# Patient Record
Sex: Female | Born: 1950 | Race: White | Hispanic: No | Marital: Married | Smoking: Never smoker
Health system: Southern US, Community
[De-identification: ages and names within clinical notes are randomized; demographics above are authoritative.]

## PROBLEM LIST (undated history)

## (undated) DIAGNOSIS — M1990 Unspecified osteoarthritis, unspecified site: Secondary | ICD-10-CM

## (undated) DIAGNOSIS — I10 Essential (primary) hypertension: Secondary | ICD-10-CM

## (undated) DIAGNOSIS — R011 Cardiac murmur, unspecified: Secondary | ICD-10-CM

## (undated) DIAGNOSIS — E785 Hyperlipidemia, unspecified: Secondary | ICD-10-CM

## (undated) DIAGNOSIS — F329 Major depressive disorder, single episode, unspecified: Secondary | ICD-10-CM

## (undated) DIAGNOSIS — R51 Headache: Secondary | ICD-10-CM

## (undated) DIAGNOSIS — F32A Depression, unspecified: Secondary | ICD-10-CM

## (undated) DIAGNOSIS — H409 Unspecified glaucoma: Secondary | ICD-10-CM

## (undated) DIAGNOSIS — R519 Headache, unspecified: Secondary | ICD-10-CM

## (undated) DIAGNOSIS — E039 Hypothyroidism, unspecified: Secondary | ICD-10-CM

## (undated) HISTORY — PX: COLONOSCOPY W/ POLYPECTOMY: SHX1380

## (undated) HISTORY — PX: WISDOM TOOTH EXTRACTION: SHX21

## (undated) HISTORY — PX: ABDOMINAL HYSTERECTOMY: SHX81

## (undated) HISTORY — DX: Essential (primary) hypertension: I10

## (undated) HISTORY — DX: Hyperlipidemia, unspecified: E78.5

---

## 1999-03-15 ENCOUNTER — Other Ambulatory Visit: Admission: RE | Admit: 1999-03-15 | Discharge: 1999-03-15 | Payer: Self-pay | Admitting: General Surgery

## 1999-04-13 ENCOUNTER — Other Ambulatory Visit: Admission: RE | Admit: 1999-04-13 | Discharge: 1999-04-13 | Payer: Self-pay | Admitting: Obstetrics and Gynecology

## 2000-05-10 ENCOUNTER — Other Ambulatory Visit: Admission: RE | Admit: 2000-05-10 | Discharge: 2000-05-10 | Payer: Self-pay | Admitting: Obstetrics and Gynecology

## 2001-07-28 ENCOUNTER — Other Ambulatory Visit: Admission: RE | Admit: 2001-07-28 | Discharge: 2001-07-28 | Payer: Self-pay | Admitting: Obstetrics and Gynecology

## 2003-04-28 ENCOUNTER — Other Ambulatory Visit: Admission: RE | Admit: 2003-04-28 | Discharge: 2003-04-28 | Payer: Self-pay | Admitting: Obstetrics and Gynecology

## 2005-10-02 ENCOUNTER — Other Ambulatory Visit: Admission: RE | Admit: 2005-10-02 | Discharge: 2005-10-02 | Payer: Self-pay | Admitting: Obstetrics and Gynecology

## 2008-03-05 ENCOUNTER — Encounter: Admission: RE | Admit: 2008-03-05 | Discharge: 2008-03-05 | Payer: Self-pay | Admitting: Family Medicine

## 2010-02-16 ENCOUNTER — Encounter: Admission: RE | Admit: 2010-02-16 | Discharge: 2010-02-16 | Payer: Self-pay | Admitting: Internal Medicine

## 2011-05-02 ENCOUNTER — Other Ambulatory Visit: Payer: Self-pay | Admitting: Family Medicine

## 2011-05-02 DIAGNOSIS — E049 Nontoxic goiter, unspecified: Secondary | ICD-10-CM

## 2011-05-14 ENCOUNTER — Ambulatory Visit
Admission: RE | Admit: 2011-05-14 | Discharge: 2011-05-14 | Disposition: A | Discharge status: Home / Self Care | Payer: Managed Care, Other (non HMO) | Source: Ambulatory Visit | Attending: Family Medicine | Admitting: Family Medicine

## 2011-05-14 DIAGNOSIS — E049 Nontoxic goiter, unspecified: Secondary | ICD-10-CM

## 2011-11-06 ENCOUNTER — Other Ambulatory Visit: Payer: Self-pay | Admitting: Family Medicine

## 2011-11-06 DIAGNOSIS — E049 Nontoxic goiter, unspecified: Secondary | ICD-10-CM

## 2011-11-12 ENCOUNTER — Ambulatory Visit
Admission: RE | Admit: 2011-11-12 | Discharge: 2011-11-12 | Disposition: A | Discharge status: Home / Self Care | Payer: Managed Care, Other (non HMO) | Source: Ambulatory Visit | Attending: Family Medicine | Admitting: Family Medicine

## 2011-11-12 DIAGNOSIS — E049 Nontoxic goiter, unspecified: Secondary | ICD-10-CM

## 2012-02-13 ENCOUNTER — Telehealth: Payer: Self-pay

## 2012-02-13 NOTE — Telephone Encounter (Signed)
Pt calling and c/o increased cost of Estring.  Can she use Femring instead? Pt has appt in Dec for AEX.  Will send message to Sr for instructions and will send chart.  ld

## 2012-02-14 NOTE — Telephone Encounter (Signed)
Estring and Femring are NOT the same and both are expensive. May use Vagifem 10 mcg per vagina 2 x weekly PRN  #24 / refill x 1

## 2012-02-15 MED ORDER — ESTRADIOL 10 MCG VA TABS: 1.0000 | ORAL_TABLET | VAGINAL | Status: DC

## 2012-02-15 NOTE — Telephone Encounter (Signed)
LM for pt that SR suggests Vagifem 2 x weekly.  #24 with 1 refill sent to [facility] [address]. Pt to call with questions or concerns.  ld

## 2012-05-21 ENCOUNTER — Ambulatory Visit: Payer: Self-pay | Admitting: Obstetrics and Gynecology

## 2012-06-25 ENCOUNTER — Ambulatory Visit: Payer: Managed Care, Other (non HMO) | Admitting: Obstetrics and Gynecology

## 2012-06-25 ENCOUNTER — Encounter: Payer: Self-pay | Admitting: Obstetrics and Gynecology

## 2012-06-25 VITALS — BP 126/80 | Ht 61.75 in | Wt 179.0 lb

## 2012-06-25 DIAGNOSIS — I10 Essential (primary) hypertension: Secondary | ICD-10-CM

## 2012-06-25 DIAGNOSIS — Z90710 Acquired absence of both cervix and uterus: Secondary | ICD-10-CM

## 2012-06-25 DIAGNOSIS — E039 Hypothyroidism, unspecified: Secondary | ICD-10-CM

## 2012-06-25 DIAGNOSIS — E785 Hyperlipidemia, unspecified: Secondary | ICD-10-CM | POA: Insufficient documentation

## 2012-06-25 DIAGNOSIS — Z01419 Encounter for gynecological examination (general) (routine) without abnormal findings: Secondary | ICD-10-CM

## 2012-06-25 MED ORDER — ESTRADIOL 2 MG VA RING: 2.0000 mg | VAGINAL_RING | VAGINAL | Status: DC

## 2012-06-25 NOTE — Progress Notes (Signed)
The patient  Taking Vagifem  hormone replacement therapy The patient  is taking a Calcium supplement. Post-menopausal bleeding:no  Last Pap: 02/17/2008  Last mammogram: 04/16/2012 Normal Last DEXA scan : T= 0.3  05/14/2010 Last colonoscopy:02/04/2009 Right colon polyp  Urinary symptoms: none Normal bowel movements: Yes Reports abuse at home: No  Subjective:    Marisa Hughes is a 62 y.o. female No obstetric history on file. who presents for annual exam.  The patient has no complaints today.   The following portions of the patient's history were reviewed and updated as appropriate: allergies, current medications, past family history, past medical history, past social history, past surgical history and problem list.  Review of Systems Pertinent items are noted in HPI. Gastrointestinal:No change in bowel habits, no abdominal pain, no rectal bleeding Genitourinary:negative for dysuria, frequency, hematuria, nocturia and urinary incontinence    Objective:     BP 126/80  Ht 5' 1.75" (1.568 m)  Wt 179 lb (81.194 kg)  BMI 33.01 kg/m2  LMP 09/21/1991  Weight:  Wt Readings from Last 1 Encounters:  06/25/12 179 lb (81.194 kg)     BMI: Body mass index is 33.01 kg/(m^2). General Appearance: Alert, appropriate appearance for age. No acute distress HEENT: Grossly normal Neck / Thyroid: Supple, no masses, nodes or enlargement Lungs: clear to auscultation bilaterally Back: No CVA tenderness Breast Exam: No masses or nodes.No dimpling, nipple retraction or discharge. Cardiovascular: Regular rate and rhythm. S1, S2, no murmur Gastrointestinal: Soft, non-tender, no masses or organomegaly Pelvic Exam: Vulva and vagina appear normal. Bimanual exam reveals normal adnexa.Uterus surgically absent Rectovaginal: normal rectal, no masses Lymphatic Exam: Non-palpable nodes in neck, clavicular, axillary, or inguinal regions  Skin: no rash or abnormalities Neurologic: Normal gait and speech, no  tremor  Psychiatric: Alert and oriented, appropriate affect.   Assessment:    Normal gyn exam    Plan:   mammogram return annually or prn Bone density 2015 Estring Rx sent to pharmacy   Silverio Lay MD

## 2014-01-20 ENCOUNTER — Other Ambulatory Visit: Payer: Self-pay | Admitting: Family Medicine

## 2014-01-20 DIAGNOSIS — E049 Nontoxic goiter, unspecified: Secondary | ICD-10-CM

## 2014-02-16 ENCOUNTER — Ambulatory Visit
Admission: RE | Admit: 2014-02-16 | Discharge: 2014-02-16 | Disposition: A | Discharge status: Home / Self Care | Payer: Commercial Managed Care - HMO | Source: Ambulatory Visit | Attending: Family Medicine | Admitting: Family Medicine

## 2014-02-16 DIAGNOSIS — E049 Nontoxic goiter, unspecified: Secondary | ICD-10-CM

## 2014-04-05 ENCOUNTER — Encounter: Payer: Self-pay | Admitting: Obstetrics and Gynecology

## 2015-02-22 ENCOUNTER — Ambulatory Visit (INDEPENDENT_AMBULATORY_CARE_PROVIDER_SITE_OTHER): Payer: PPO | Admitting: Podiatry

## 2015-02-22 ENCOUNTER — Encounter: Payer: Self-pay | Admitting: Podiatry

## 2015-02-22 ENCOUNTER — Ambulatory Visit (INDEPENDENT_AMBULATORY_CARE_PROVIDER_SITE_OTHER): Payer: PPO

## 2015-02-22 VITALS — BP 131/73 | HR 61 | Resp 16 | Ht 62.0 in | Wt 178.0 lb

## 2015-02-22 DIAGNOSIS — M722 Plantar fascial fibromatosis: Secondary | ICD-10-CM | POA: Diagnosis not present

## 2015-02-22 MED ORDER — MELOXICAM 15 MG PO TABS: 15.0000 mg | ORAL_TABLET | Freq: Every day | ORAL | Status: DC

## 2015-02-22 MED ORDER — METHYLPREDNISOLONE 4 MG PO TBPK: ORAL_TABLET | ORAL | Status: DC

## 2015-02-22 NOTE — Patient Instructions (Signed)

## 2015-02-22 NOTE — Progress Notes (Signed)
   Subjective:    Patient ID: [name], female    DOB: 03-15-1951, 64 y.o.   MRN: [id number]  HPI: Argie presents today as a 64 year old female with a chief complaint of pain to her right heel 6 months. She states that mornings are particularly painful upon early ambulation. She states that she has tried ibuprofen as well as a compression anklet which did help to some degree.    Review of Systems  Eyes: Positive for visual disturbance.  Skin:       Changes in hair  All other systems reviewed and are negative.      Objective:   Physical Exam: 64 year old female without complications and was in no acute distress presents with right heel pain. Vital signs are stable alert and oriented 3. Pulses are strongly palpable. Neurologic sensorium is intact per Semmes-Weinstein monofilament. Deep tendon reflexes are intact bilateral and muscle strength +5 over 5 dorsiflexion plantar flexors and inverters everters all intrinsic musculature is intact. Orthopedic evaluation demonstrates all joints distal to the ankle range of motion without crepitation. Mild flexible pes planus is noted bilateral. She has pain on palpation medial calcaneal tubercle of the right heel. 3 views radiographs taken today demonstrate a soft tissue increase in density at the plantar fascial calcaneal insertion site of the right heel. Cutaneous evaluation demonstrates supple well-hydrated cutis no erythema edema cellulitis drainage or odor.        Assessment & Plan:  Assessment: Plantar fasciitis right foot.  Plan: We discussed the etiology pathology conservative versus surgical therapies. At this point I injected her right heel today with Kenalog and local and aesthetic. Started her on a Medrol Dosepak to be followed by meloxicam. We also placed her in a plantar fascial brace and a night splint. Discussed appropriate shoe gear stretching exercises and ice therapy. I will follow-up with her in 1 month.

## 2015-03-22 ENCOUNTER — Encounter: Payer: Self-pay | Admitting: Podiatry

## 2015-03-22 ENCOUNTER — Ambulatory Visit (INDEPENDENT_AMBULATORY_CARE_PROVIDER_SITE_OTHER): Payer: PPO | Admitting: Podiatry

## 2015-03-22 VITALS — BP 115/58 | HR 66 | Resp 16

## 2015-03-22 DIAGNOSIS — M722 Plantar fascial fibromatosis: Secondary | ICD-10-CM

## 2015-03-22 NOTE — Progress Notes (Signed)
She presents today stating that her plantar fasciitis is approximately 98% improved. She continues on conservative therapies including anti-inflammatories or fascial brace and a night splint.  Objective: Vital signs are stable she is alert and oriented 3. Pulses are strongly palpable. No pain on palpation medial calcaneal tubercle of the left heel.  Assessment: Plantar fasciitis left.  Plan: Continue all conservative therapies anti-inflammatories braces and boots. Follow up with me in 1 month if necessary.

## 2015-04-25 ENCOUNTER — Telehealth: Payer: Self-pay | Admitting: *Deleted

## 2015-04-25 NOTE — Telephone Encounter (Signed)
Fax request for 90 days given +3 refills.  Return faxed.

## 2015-07-26 DIAGNOSIS — L669 Cicatricial alopecia, unspecified: Secondary | ICD-10-CM | POA: Diagnosis not present

## 2015-07-26 DIAGNOSIS — I10 Essential (primary) hypertension: Secondary | ICD-10-CM | POA: Diagnosis not present

## 2015-08-09 DIAGNOSIS — I10 Essential (primary) hypertension: Secondary | ICD-10-CM | POA: Diagnosis not present

## 2015-11-01 DIAGNOSIS — Z6834 Body mass index (BMI) 34.0-34.9, adult: Secondary | ICD-10-CM | POA: Diagnosis not present

## 2015-11-01 DIAGNOSIS — Z1231 Encounter for screening mammogram for malignant neoplasm of breast: Secondary | ICD-10-CM | POA: Diagnosis not present

## 2015-11-01 DIAGNOSIS — N952 Postmenopausal atrophic vaginitis: Secondary | ICD-10-CM | POA: Diagnosis not present

## 2015-11-01 DIAGNOSIS — Z01419 Encounter for gynecological examination (general) (routine) without abnormal findings: Secondary | ICD-10-CM | POA: Diagnosis not present

## 2015-11-08 DIAGNOSIS — H401122 Primary open-angle glaucoma, left eye, moderate stage: Secondary | ICD-10-CM | POA: Diagnosis not present

## 2015-11-08 DIAGNOSIS — H524 Presbyopia: Secondary | ICD-10-CM | POA: Diagnosis not present

## 2015-11-08 DIAGNOSIS — H401111 Primary open-angle glaucoma, right eye, mild stage: Secondary | ICD-10-CM | POA: Diagnosis not present

## 2015-11-08 DIAGNOSIS — H40052 Ocular hypertension, left eye: Secondary | ICD-10-CM | POA: Diagnosis not present

## 2016-01-05 DIAGNOSIS — M25562 Pain in left knee: Secondary | ICD-10-CM | POA: Diagnosis not present

## 2016-01-11 DIAGNOSIS — M1712 Unilateral primary osteoarthritis, left knee: Secondary | ICD-10-CM | POA: Diagnosis not present

## 2016-02-21 DIAGNOSIS — M1712 Unilateral primary osteoarthritis, left knee: Secondary | ICD-10-CM | POA: Diagnosis not present

## 2016-03-07 DIAGNOSIS — M1712 Unilateral primary osteoarthritis, left knee: Secondary | ICD-10-CM | POA: Diagnosis not present

## 2016-04-16 DIAGNOSIS — Z23 Encounter for immunization: Secondary | ICD-10-CM | POA: Diagnosis not present

## 2016-04-16 DIAGNOSIS — I83029 Varicose veins of left lower extremity with ulcer of unspecified site: Secondary | ICD-10-CM | POA: Diagnosis not present

## 2016-05-14 ENCOUNTER — Other Ambulatory Visit: Payer: Self-pay | Admitting: Podiatry

## 2016-05-15 DIAGNOSIS — I10 Essential (primary) hypertension: Secondary | ICD-10-CM | POA: Diagnosis not present

## 2016-05-21 DIAGNOSIS — H04123 Dry eye syndrome of bilateral lacrimal glands: Secondary | ICD-10-CM | POA: Diagnosis not present

## 2016-05-21 DIAGNOSIS — H401111 Primary open-angle glaucoma, right eye, mild stage: Secondary | ICD-10-CM | POA: Diagnosis not present

## 2016-05-21 DIAGNOSIS — H401122 Primary open-angle glaucoma, left eye, moderate stage: Secondary | ICD-10-CM | POA: Diagnosis not present

## 2016-05-21 DIAGNOSIS — H524 Presbyopia: Secondary | ICD-10-CM | POA: Diagnosis not present

## 2016-05-29 ENCOUNTER — Other Ambulatory Visit: Payer: Self-pay | Admitting: Orthopedic Surgery

## 2016-06-14 ENCOUNTER — Encounter (HOSPITAL_COMMUNITY): Payer: Self-pay

## 2016-06-14 ENCOUNTER — Ambulatory Visit (HOSPITAL_COMMUNITY)
Admission: RE | Admit: 2016-06-14 | Discharge: 2016-06-14 | Disposition: A | Discharge status: Home / Self Care | Payer: PPO | Source: Ambulatory Visit | Attending: Orthopedic Surgery | Admitting: Orthopedic Surgery

## 2016-06-14 ENCOUNTER — Encounter (HOSPITAL_COMMUNITY)
Admission: RE | Admit: 2016-06-14 | Discharge: 2016-06-14 | Disposition: A | Discharge status: Home / Self Care | Payer: PPO | Source: Ambulatory Visit | Attending: Orthopedic Surgery | Admitting: Orthopedic Surgery

## 2016-06-14 DIAGNOSIS — Z01818 Encounter for other preprocedural examination: Secondary | ICD-10-CM

## 2016-06-14 DIAGNOSIS — Z01812 Encounter for preprocedural laboratory examination: Secondary | ICD-10-CM | POA: Diagnosis not present

## 2016-06-14 DIAGNOSIS — Z01810 Encounter for preprocedural cardiovascular examination: Secondary | ICD-10-CM | POA: Diagnosis not present

## 2016-06-14 HISTORY — DX: Headache: R51

## 2016-06-14 HISTORY — DX: Major depressive disorder, single episode, unspecified: F32.9

## 2016-06-14 HISTORY — DX: Unspecified glaucoma: H40.9

## 2016-06-14 HISTORY — DX: Headache, unspecified: R51.9

## 2016-06-14 HISTORY — DX: Hypothyroidism, unspecified: E03.9

## 2016-06-14 HISTORY — DX: Unspecified osteoarthritis, unspecified site: M19.90

## 2016-06-14 HISTORY — DX: Cardiac murmur, unspecified: R01.1

## 2016-06-14 HISTORY — DX: Depression, unspecified: F32.A

## 2016-06-14 LAB — URINALYSIS, ROUTINE W REFLEX MICROSCOPIC
Bilirubin Urine: NEGATIVE
Glucose, UA: NEGATIVE mg/dL
HGB URINE DIPSTICK: NEGATIVE
Ketones, ur: NEGATIVE mg/dL
Leukocytes, UA: NEGATIVE
NITRITE: NEGATIVE
PROTEIN: NEGATIVE mg/dL
SPECIFIC GRAVITY, URINE: 1.006 (ref 1.005–1.030)
pH: 6 (ref 5.0–8.0)

## 2016-06-14 LAB — TYPE AND SCREEN
ABO/RH(D): A POS
Antibody Screen: NEGATIVE

## 2016-06-14 LAB — CBC WITH DIFFERENTIAL/PLATELET
BASOS ABS: 0 10*3/uL (ref 0.0–0.1)
Basophils Relative: 0 %
EOS PCT: 2 %
Eosinophils Absolute: 0.1 10*3/uL (ref 0.0–0.7)
HEMATOCRIT: 38.3 % (ref 36.0–46.0)
Hemoglobin: 12.9 g/dL (ref 12.0–15.0)
LYMPHS PCT: 24 %
Lymphs Abs: 1.7 10*3/uL (ref 0.7–4.0)
MCH: 29.1 pg (ref 26.0–34.0)
MCHC: 33.7 g/dL (ref 30.0–36.0)
MCV: 86.3 fL (ref 78.0–100.0)
MONO ABS: 0.4 10*3/uL (ref 0.1–1.0)
MONOS PCT: 6 %
NEUTROS ABS: 5 10*3/uL (ref 1.7–7.7)
Neutrophils Relative %: 68 %
PLATELETS: 365 10*3/uL (ref 150–400)
RBC: 4.44 MIL/uL (ref 3.87–5.11)
RDW: 13.5 % (ref 11.5–15.5)
WBC: 7.3 10*3/uL (ref 4.0–10.5)

## 2016-06-14 LAB — ABO/RH: ABO/RH(D): A POS

## 2016-06-14 LAB — BASIC METABOLIC PANEL
ANION GAP: 10 (ref 5–15)
BUN: 11 mg/dL (ref 6–20)
CO2: 22 mmol/L (ref 22–32)
Calcium: 9.2 mg/dL (ref 8.9–10.3)
Chloride: 104 mmol/L (ref 101–111)
Creatinine, Ser: 0.87 mg/dL (ref 0.44–1.00)
GFR calc Af Amer: >60 mL/min (ref >60)
GLUCOSE: 89 mg/dL (ref 65–99)
POTASSIUM: 3.9 mmol/L (ref 3.5–5.1)
Sodium: 136 mmol/L (ref 135–145)

## 2016-06-14 LAB — SURGICAL PCR SCREEN
MRSA, PCR: NEGATIVE
STAPHYLOCOCCUS AUREUS: POSITIVE — AB

## 2016-06-14 LAB — PROTIME-INR
INR: 0.97
Prothrombin Time: 12.9 seconds (ref 11.4–15.2)

## 2016-06-14 LAB — APTT: APTT: 36 s (ref 24–36)

## 2016-06-14 NOTE — Pre-Procedure Instructions (Signed)
    Marisa Hughes  06/14/2016     Your procedure is scheduled on Monday,January 22.  Report to [facility] Admitting at 5:30 A.M.               Your surgery or procedure is scheduled for 7:30 AM   Call this number if you have problems the morning of surgery: [phone]              For any other questions, please call [phone], Monday - Friday 8 AM - 4 PM.    Remember:  Do not eat food or drink liquids after midnight Sunday, January 21.  Take these medicines the morning of surgery with A SIP OF WATER: amLODipine (NORVASC), levothyroxine (SYNTHROID, LEVOTHROID).                     May use Eye Drops.                Take if needed: traMADol Veatrice Bourbon).   Do not wear jewelry, make-up or nail polish.  Do not wear lotions, powders, or perfumes, or deodorant.  Do not shave 48 hours prior to surgery.    Do not bring valuables to the hospital.  [facility] is not responsible for any belongings or valuables.  Contacts, dentures or bridgework may not be worn into surgery.  Leave your suitcase in the car.  After surgery it may be brought to your room.  For patients admitted to the hospital, discharge time will be determined by your treatment team.  Patients discharged the day of surgery will not be allowed to drive home.   Special instructions: Review  [facility] - Preparing For Surgery.  Please read over the following fact sheets that you were given: [facility]- Preparing For Surgery and Patient Instructions for Mupirocin Application, Incentive Spirometry, Pain Booklet

## 2016-06-14 NOTE — Progress Notes (Signed)
I called a prescription [facility]int[facility] [address], [place], [place] 

## 2016-06-18 DIAGNOSIS — E041 Nontoxic single thyroid nodule: Secondary | ICD-10-CM | POA: Diagnosis not present

## 2016-06-18 DIAGNOSIS — E7800 Pure hypercholesterolemia, unspecified: Secondary | ICD-10-CM | POA: Diagnosis not present

## 2016-06-18 DIAGNOSIS — I10 Essential (primary) hypertension: Secondary | ICD-10-CM | POA: Diagnosis not present

## 2016-06-18 DIAGNOSIS — M1712 Unilateral primary osteoarthritis, left knee: Secondary | ICD-10-CM | POA: Diagnosis not present

## 2016-06-18 DIAGNOSIS — Z803 Family history of malignant neoplasm of breast: Secondary | ICD-10-CM | POA: Diagnosis not present

## 2016-06-18 DIAGNOSIS — E559 Vitamin D deficiency, unspecified: Secondary | ICD-10-CM | POA: Diagnosis not present

## 2016-06-22 DIAGNOSIS — M1712 Unilateral primary osteoarthritis, left knee: Secondary | ICD-10-CM | POA: Diagnosis present

## 2016-06-22 NOTE — H&P (Signed)
TOTAL KNEE ADMISSION H&P  Patient is being admitted for left total knee arthroplasty.  Subjective:  Chief Complaint:left knee pain.  HPI: Marisa Hughes, 66 y.o. female, has a history of pain and functional disability in the left knee due to arthritis and has failed non-surgical conservative treatments for greater than 12 weeks to includeNSAID's and/or analgesics, corticosteriod injections, use of assistive devices, weight reduction as appropriate and activity modification.  Onset of symptoms was gradual, starting 1 years ago with gradually worsening course since that time. The patient noted no past surgery on the left knee(s).  Patient currently rates pain in the left knee(s) at 10 out of 10 with activity. Patient has night pain, worsening of pain with activity and weight bearing, pain that interferes with activities of daily living, pain with passive range of motion, crepitus and joint swelling.  Patient has evidence of joint subluxation and joint space narrowing by imaging studies.   There is no active infection.  Patient Active Problem List   Diagnosis Date Noted  . Unspecified essential hypertension 06/25/2012  . Hyperlipidemia 06/25/2012  . Hypothyroidism 06/25/2012  . S/P hysterectomy 06/25/2012   Past Medical History:  Diagnosis Date  . Arthritis   . Depression    situational  . Glaucoma   . Headache    hx  . Heart murmur    years ago- not hear recently  . Hyperlipidemia   . Hypertension   . Hypothyroidism     Past Surgical History:  Procedure Laterality Date  . ABDOMINAL HYSTERECTOMY    . CESAREAN SECTION    . COLONOSCOPY W/ POLYPECTOMY    . WISDOM TOOTH EXTRACTION      No prescriptions prior to admission.   No Known Allergies  Social History  Substance Use Topics  . Smoking status: Never Smoker  . Smokeless tobacco: Never Used  . Alcohol use 3.0 oz/week    5 Glasses of wine per week    Family History  Problem Relation Age of Onset  . Kidney disease Mother    . Diabetes Mother   . Hypertension Mother   . Heart disease Mother   . Arthritis Mother   . Heart disease Father   . Heart disease Brother   . Glaucoma Brother   . Diabetes Paternal Aunt   . Cancer Paternal Aunt   . Cancer Maternal Grandmother   . Heart disease Paternal Grandfather      Review of Systems  Constitutional: Negative.   HENT: Negative.   Eyes: Negative.   Cardiovascular: Positive for leg swelling.       Htn  Gastrointestinal: Positive for constipation.  Genitourinary: Negative.   Musculoskeletal: Positive for joint pain.  Skin: Negative.   Neurological: Negative.   Endo/Heme/Allergies: Negative.   Psychiatric/Behavioral: Positive for depression. The patient has insomnia.     Objective:  Physical Exam  Constitutional: She is oriented to person, place, and time. She appears well-developed and well-nourished.  HENT:  Head: Normocephalic and atraumatic.  Eyes: Pupils are equal, round, and reactive to light.  Neck: Normal range of motion. Neck supple.  Cardiovascular: Intact distal pulses.   Respiratory: Effort normal.  Musculoskeletal: She exhibits tenderness.  The left knee has a 5 flexion contracture, flexes 125, collateral ligaments are stable.  Tender along the medial joint line.  Trace effusion.    Neurological: She is alert and oriented to person, place, and time.  Skin: Skin is warm and dry.  Psychiatric: She has a normal mood and affect. Her  behavior is normal. Judgment and thought content normal.    Vital signs in last 24 hours:    Labs:   Estimated body mass index is 33.42 kg/m as calculated from the following:   Height as of 06/14/16: 5' 1.5" (1.562 m).   Weight as of 06/14/16: 81.6 kg (179 lb 12.8 oz).   Imaging Review Plain radiographs demonstrate  bilateral AP weightbearing, bilateral Rosenberg, lateral sunrise views of the left knee are taken and reviewed in office.  Patient does have moderate to severe osteoarthritis of the medial  compartment with periarticular osteophyte formation and subchondral cyst formation.  Assessment/Plan:  End stage arthritis, left knee   The patient history, physical examination, clinical judgment of the provider and imaging studies are consistent with end stage degenerative joint disease of the left knee(s) and total knee arthroplasty is deemed medically necessary. The treatment options including medical management, injection therapy arthroscopy and arthroplasty were discussed at length. The risks and benefits of total knee arthroplasty were presented and reviewed. The risks due to aseptic loosening, infection, stiffness, patella tracking problems, thromboembolic complications and other imponderables were discussed. The patient acknowledged the explanation, agreed to proceed with the plan and consent was signed. Patient is being admitted for inpatient treatment for surgery, pain control, PT, OT, prophylactic antibiotics, VTE prophylaxis, progressive ambulation and ADL's and discharge planning. The patient is planning to be discharged home with home health services

## 2016-06-24 IMAGING — US US SOFT TISSUE HEAD/NECK
1 series · 14 of 25 positions shown · non-contrast
Comparison: 11/12/2011 and earlier studies

CLINICAL DATA: RT THYROID ENLARGEMENT

EXAM:
THYROID ULTRASOUND
TECHNIQUE: Ultrasound examination of the thyroid gland and adjacent soft
tissues was performed.

[Series 1: us soft tissue head/neck · 0.08mm/px · 14 of 36 slices shown]
[im 1/36]
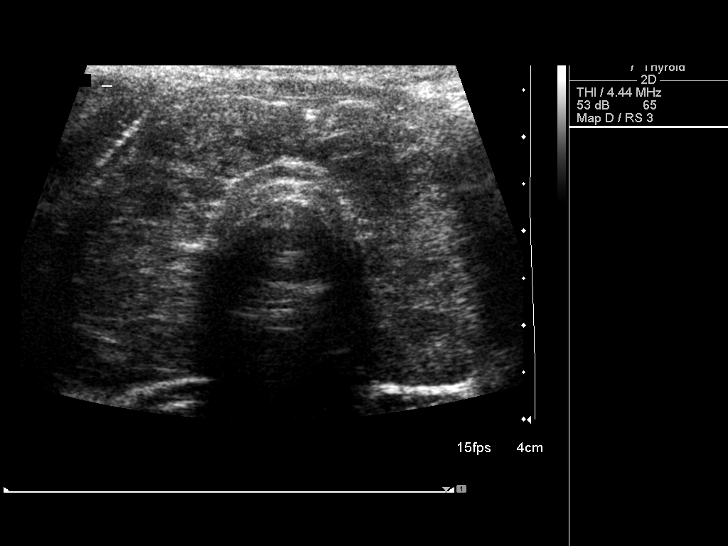
[im 3/36]
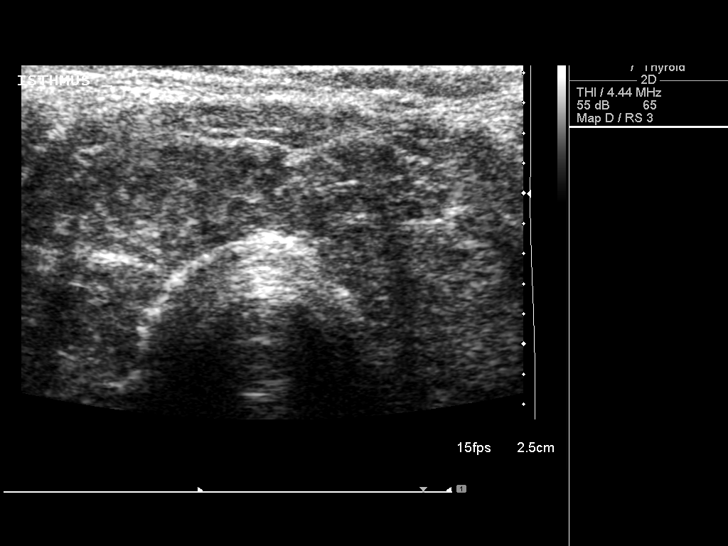
[im 6/36]
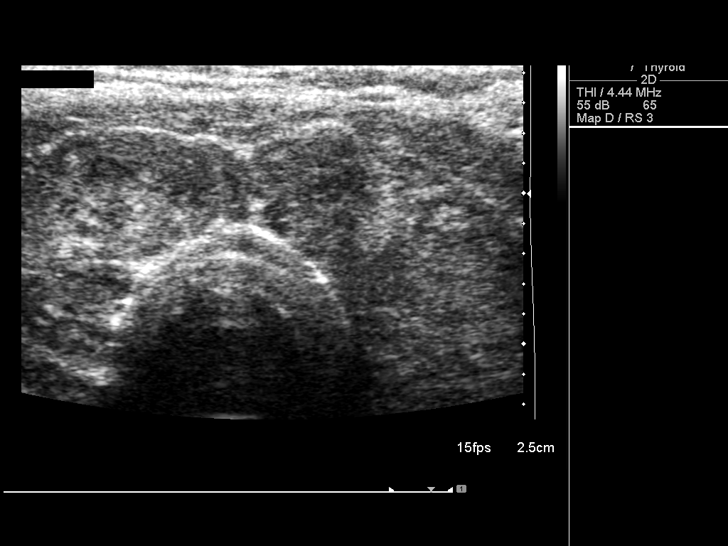
[im 9/36]
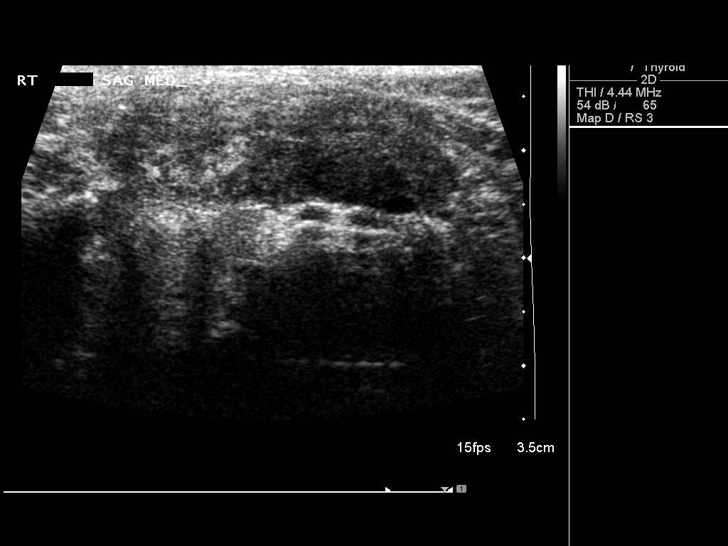
[im 12/36]
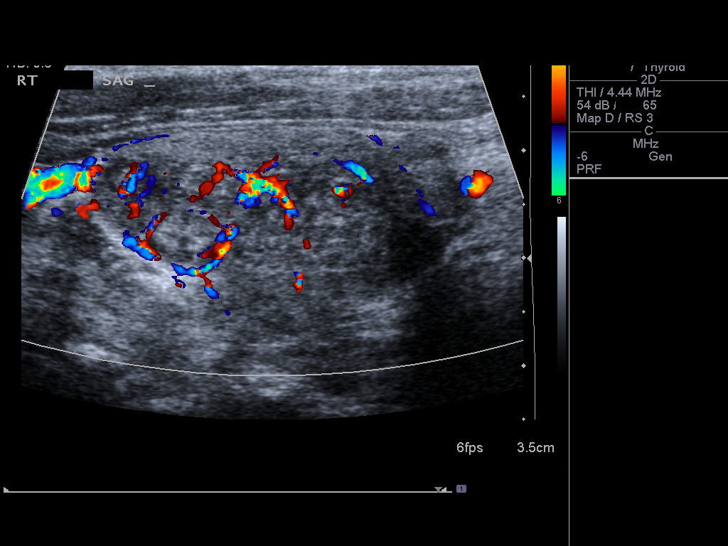
[im 14/36]
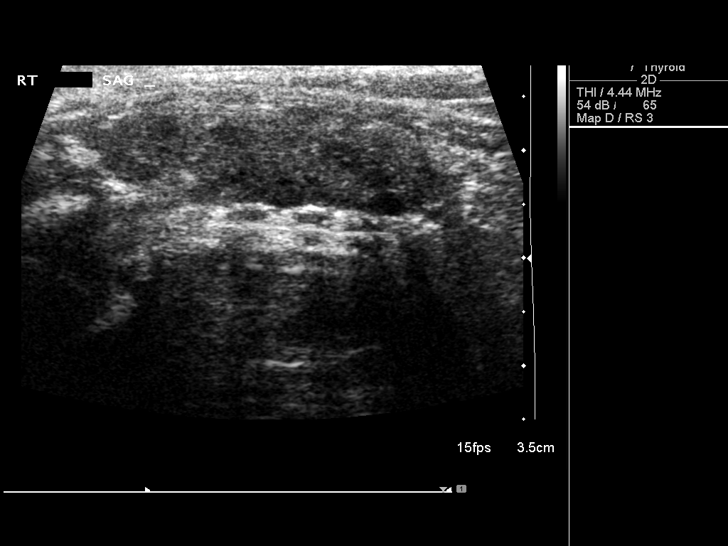
[im 17/36]
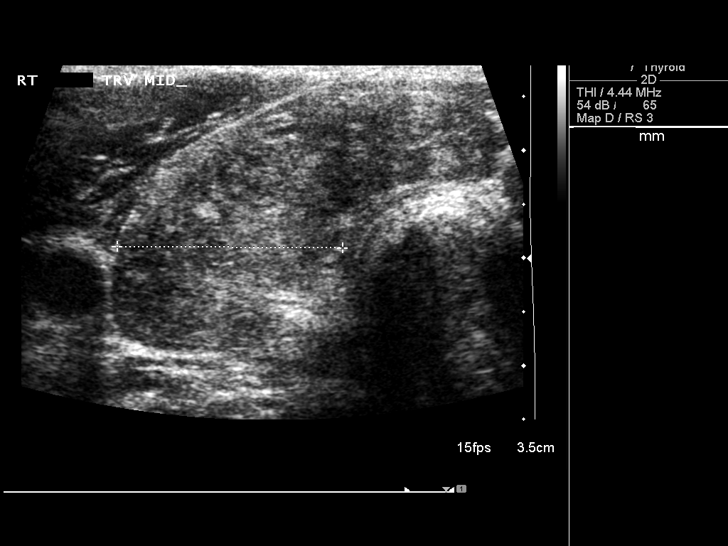
[im 19/36]
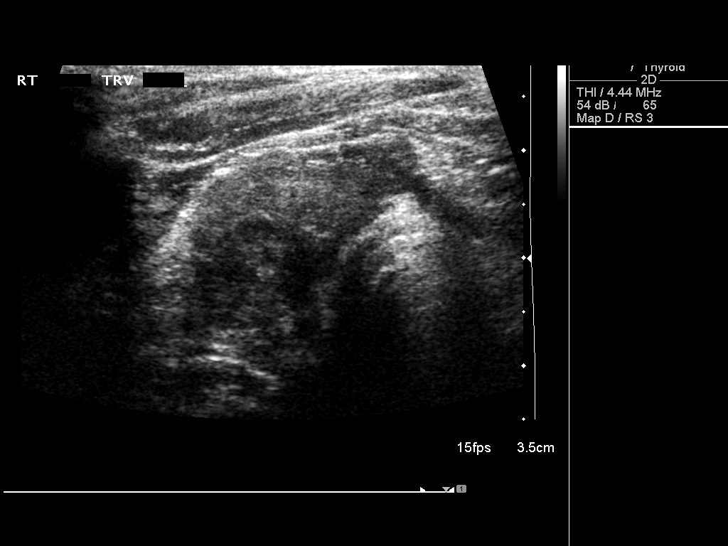
[im 22/36]
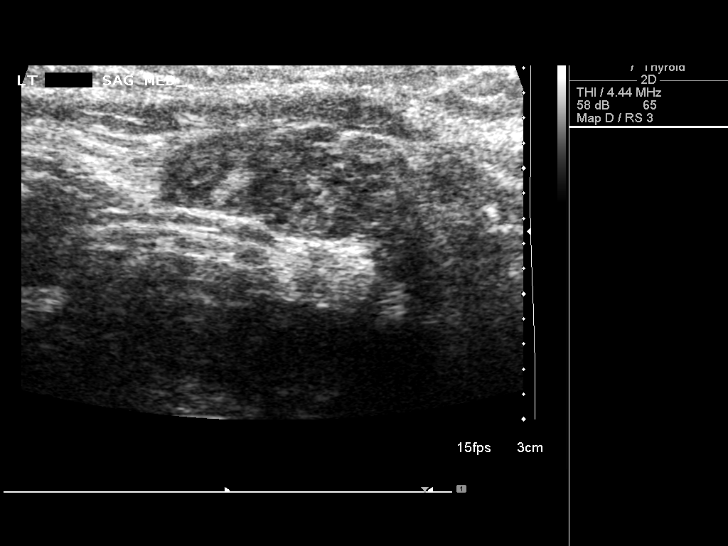
[im 24/36]
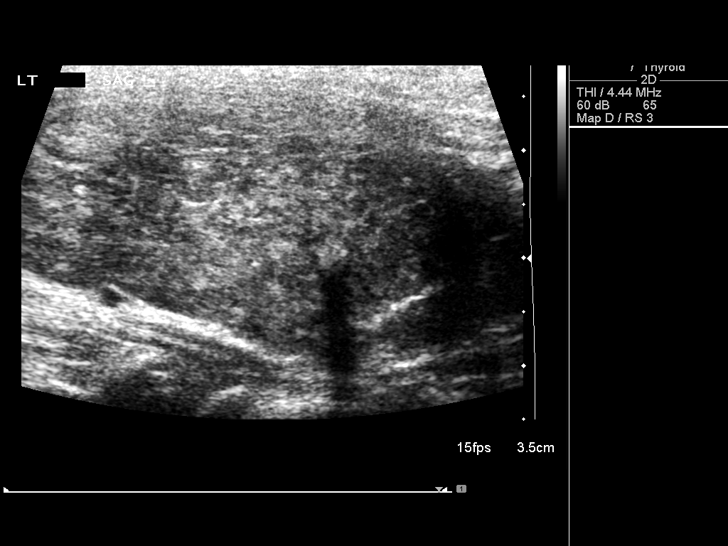
[im 27/36]
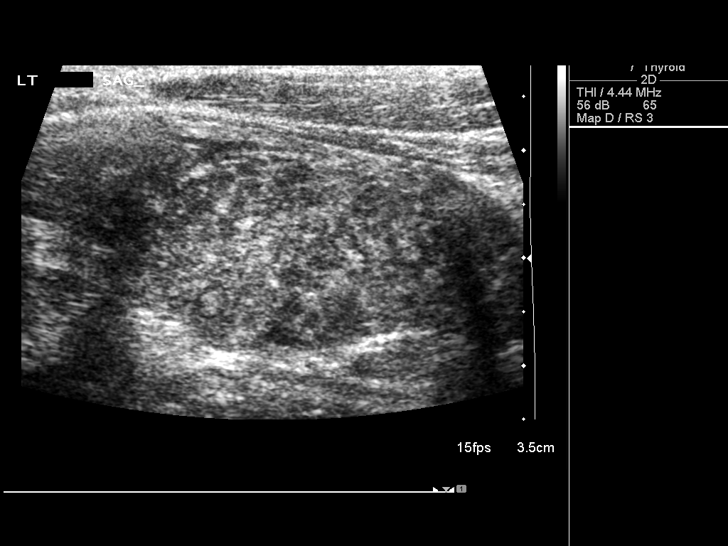
[im 30/36]
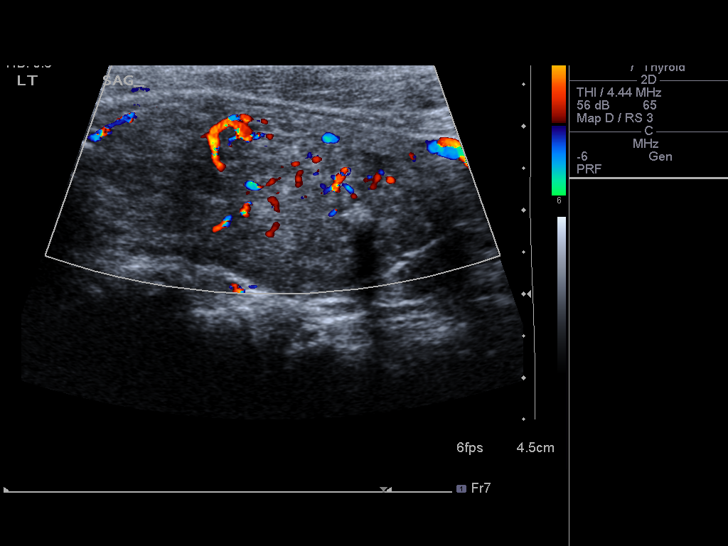
[im 33/36]
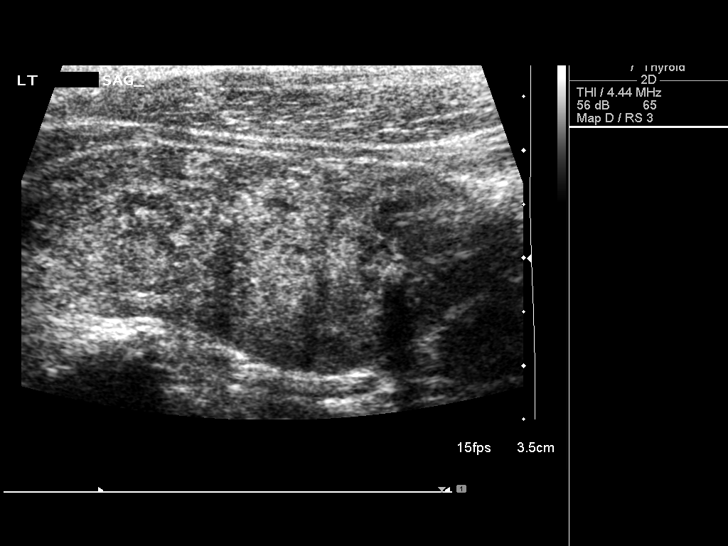
[im 36/36]
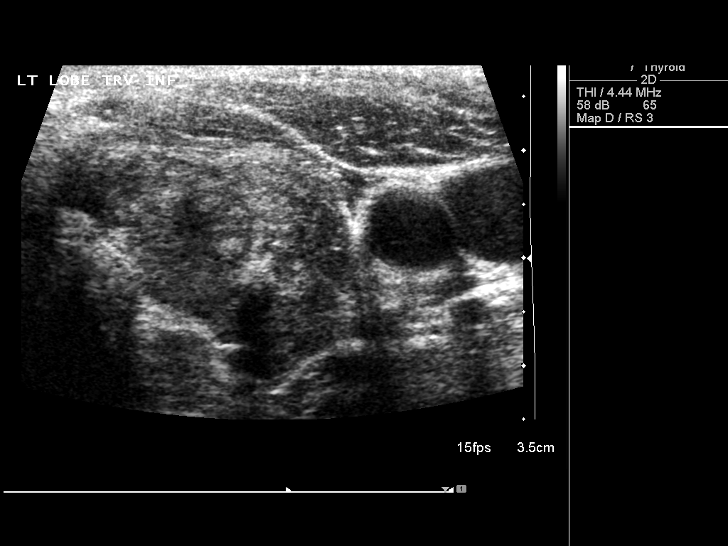

[14 of 25 positions shown; findings below may reference images not displayed]

FINDINGS: Right thyroid lobe

Measurements: 43 x 25 x 21 mm. Heterogeneous echotexture without
focal lesion.

Left thyroid lobe

Measurements: 50 x 23 x 21 mm. Single 4 x 3 mm echogenic partially
calcified nodule, inferior pole.

Isthmus

Thickness: 6 mm.  No nodules visualized.

Lymphadenopathy

None visualized.
IMPRESSION: 1. Thyromegaly with a single small left nodule. Findings do not meet
current consensus criteria for biopsy. Follow-up by clinical exam is
recommended. If patient has known risk factors for thyroid
carcinoma, consider follow-up ultrasound in 12 months. If patient is
clinically hyperthyroid, consider nuclear medicine thyroid uptake
and scan. This recommendation follows the consensus statement:
Management of Thyroid Nodules Detected as US: Society of
Radiologists in Ultrasound Consensus Conference Statement. Radiology

## 2016-06-25 ENCOUNTER — Inpatient Hospital Stay (HOSPITAL_COMMUNITY): Payer: PPO | Admitting: Anesthesiology

## 2016-06-25 ENCOUNTER — Inpatient Hospital Stay (HOSPITAL_COMMUNITY)
Admission: RE | Admit: 2016-06-25 | Discharge: 2016-06-27 | DRG: 470 | Disposition: A | Discharge status: Home Health | Payer: PPO | Source: Ambulatory Visit | Attending: Orthopedic Surgery | Admitting: Orthopedic Surgery

## 2016-06-25 ENCOUNTER — Encounter (HOSPITAL_COMMUNITY)
Admission: RE | Disposition: A | Discharge status: Home Health | Payer: Self-pay | Source: Ambulatory Visit | Attending: Orthopedic Surgery

## 2016-06-25 ENCOUNTER — Encounter (HOSPITAL_COMMUNITY): Payer: Self-pay | Admitting: *Deleted

## 2016-06-25 DIAGNOSIS — I10 Essential (primary) hypertension: Secondary | ICD-10-CM | POA: Diagnosis present

## 2016-06-25 DIAGNOSIS — F329 Major depressive disorder, single episode, unspecified: Secondary | ICD-10-CM | POA: Diagnosis not present

## 2016-06-25 DIAGNOSIS — M25562 Pain in left knee: Secondary | ICD-10-CM | POA: Diagnosis not present

## 2016-06-25 DIAGNOSIS — M1712 Unilateral primary osteoarthritis, left knee: Principal | ICD-10-CM | POA: Diagnosis present

## 2016-06-25 DIAGNOSIS — Z8261 Family history of arthritis: Secondary | ICD-10-CM

## 2016-06-25 DIAGNOSIS — E039 Hypothyroidism, unspecified: Secondary | ICD-10-CM | POA: Diagnosis present

## 2016-06-25 DIAGNOSIS — E785 Hyperlipidemia, unspecified: Secondary | ICD-10-CM | POA: Diagnosis present

## 2016-06-25 DIAGNOSIS — H409 Unspecified glaucoma: Secondary | ICD-10-CM | POA: Diagnosis present

## 2016-06-25 DIAGNOSIS — Z8249 Family history of ischemic heart disease and other diseases of the circulatory system: Secondary | ICD-10-CM

## 2016-06-25 DIAGNOSIS — Z83511 Family history of glaucoma: Secondary | ICD-10-CM | POA: Diagnosis not present

## 2016-06-25 DIAGNOSIS — G8918 Other acute postprocedural pain: Secondary | ICD-10-CM | POA: Diagnosis not present

## 2016-06-25 DIAGNOSIS — Z96652 Presence of left artificial knee joint: Secondary | ICD-10-CM | POA: Diagnosis not present

## 2016-06-25 HISTORY — PX: TOTAL KNEE ARTHROPLASTY: SHX125

## 2016-06-25 SURGERY — ARTHROPLASTY, KNEE, TOTAL
Anesthesia: Monitor Anesthesia Care | Site: Knee | Laterality: Left

## 2016-06-25 MED ORDER — ZOLPIDEM TARTRATE 5 MG PO TABS: 5.0000 mg | ORAL_TABLET | Freq: Every evening | ORAL | Status: DC | PRN

## 2016-06-25 MED ORDER — 0.9 % SODIUM CHLORIDE (POUR BTL) OPTIME
TOPICAL | Status: DC | PRN
  Administered 2016-06-25: 1000 mL

## 2016-06-25 MED ORDER — ONDANSETRON HCL 4 MG/2ML IJ SOLN
INTRAMUSCULAR | Status: DC | PRN
  Administered 2016-06-25: 4 mg via INTRAVENOUS

## 2016-06-25 MED ORDER — MIDAZOLAM HCL 5 MG/5ML IJ SOLN
INTRAMUSCULAR | Status: DC | PRN
  Administered 2016-06-25: 2 mg via INTRAVENOUS

## 2016-06-25 MED ORDER — SPIRONOLACTONE 50 MG PO TABS
50.0000 mg | ORAL_TABLET | Freq: Every day | ORAL | Status: DC
  Administered 2016-06-26 – 2016-06-27 (×2): 50 mg via ORAL
  Filled 2016-06-25: qty 1
  Filled 2016-06-25 (×2): qty 2
  Filled 2016-06-25: qty 1

## 2016-06-25 MED ORDER — TRANEXAMIC ACID 1000 MG/10ML IV SOLN
2000.0000 mg | Freq: Once | INTRAVENOUS | Status: AC
  Administered 2016-06-25: 2000 mg via TOPICAL
  Filled 2016-06-25: qty 20

## 2016-06-25 MED ORDER — CEFAZOLIN SODIUM-DEXTROSE 2-4 GM/100ML-% IV SOLN
INTRAVENOUS | Status: AC
  Filled 2016-06-25: qty 100

## 2016-06-25 MED ORDER — CHLORHEXIDINE GLUCONATE 4 % EX LIQD: 60.0000 mL | Freq: Once | CUTANEOUS | Status: DC

## 2016-06-25 MED ORDER — DEXAMETHASONE SODIUM PHOSPHATE 10 MG/ML IJ SOLN
INTRAMUSCULAR | Status: AC
  Filled 2016-06-25: qty 1

## 2016-06-25 MED ORDER — OXYCODONE HCL 5 MG PO TABS: 5.0000 mg | ORAL_TABLET | Freq: Once | ORAL | Status: DC | PRN

## 2016-06-25 MED ORDER — PROPOFOL 10 MG/ML IV BOLUS
INTRAVENOUS | Status: AC
  Filled 2016-06-25: qty 20

## 2016-06-25 MED ORDER — HYDROMORPHONE HCL 1 MG/ML IJ SOLN: 1.0000 mg | INTRAMUSCULAR | Status: DC | PRN

## 2016-06-25 MED ORDER — METHOCARBAMOL 1000 MG/10ML IJ SOLN
500.0000 mg | Freq: Four times a day (QID) | INTRAMUSCULAR | Status: DC | PRN
  Filled 2016-06-25: qty 5

## 2016-06-25 MED ORDER — ACETAMINOPHEN 325 MG PO TABS
650.0000 mg | ORAL_TABLET | Freq: Four times a day (QID) | ORAL | Status: DC | PRN
  Administered 2016-06-26 – 2016-06-27 (×2): 650 mg via ORAL
  Filled 2016-06-25 (×2): qty 2

## 2016-06-25 MED ORDER — ONDANSETRON HCL 4 MG/2ML IJ SOLN
INTRAMUSCULAR | Status: AC
  Filled 2016-06-25: qty 2

## 2016-06-25 MED ORDER — OXYCODONE HCL 5 MG PO TABS
5.0000 mg | ORAL_TABLET | ORAL | Status: DC | PRN
  Administered 2016-06-25 – 2016-06-26 (×4): 10 mg via ORAL
  Filled 2016-06-25 (×4): qty 2

## 2016-06-25 MED ORDER — DEXAMETHASONE SODIUM PHOSPHATE 10 MG/ML IJ SOLN
INTRAMUSCULAR | Status: DC | PRN
  Administered 2016-06-25: 10 mg via INTRAVENOUS

## 2016-06-25 MED ORDER — FENTANYL CITRATE (PF) 100 MCG/2ML IJ SOLN
INTRAMUSCULAR | Status: AC
  Filled 2016-06-25: qty 2

## 2016-06-25 MED ORDER — BUPIVACAINE LIPOSOME 1.3 % IJ SUSP
INTRAMUSCULAR | Status: DC | PRN
  Administered 2016-06-25: 20 mL

## 2016-06-25 MED ORDER — DOCUSATE SODIUM 100 MG PO CAPS
100.0000 mg | ORAL_CAPSULE | Freq: Two times a day (BID) | ORAL | Status: DC
  Administered 2016-06-25 – 2016-06-27 (×5): 100 mg via ORAL
  Filled 2016-06-25 (×5): qty 1

## 2016-06-25 MED ORDER — ONDANSETRON HCL 4 MG/2ML IJ SOLN: 4.0000 mg | Freq: Once | INTRAMUSCULAR | Status: DC | PRN

## 2016-06-25 MED ORDER — ASPIRIN EC 325 MG PO TBEC: 325.0000 mg | DELAYED_RELEASE_TABLET | Freq: Two times a day (BID) | ORAL | 0 refills | Status: AC

## 2016-06-25 MED ORDER — MIDAZOLAM HCL 2 MG/2ML IJ SOLN
INTRAMUSCULAR | Status: AC
  Filled 2016-06-25: qty 2

## 2016-06-25 MED ORDER — LATANOPROST 0.005 % OP SOLN
1.0000 [drp] | Freq: Every day | OPHTHALMIC | Status: DC
  Administered 2016-06-25 – 2016-06-26 (×2): 1 [drp] via OPHTHALMIC
  Filled 2016-06-25: qty 2.5

## 2016-06-25 MED ORDER — TIZANIDINE HCL 2 MG PO TABS: 2.0000 mg | ORAL_TABLET | Freq: Four times a day (QID) | ORAL | 0 refills | Status: AC | PRN

## 2016-06-25 MED ORDER — LIDOCAINE 2% (20 MG/ML) 5 ML SYRINGE
INTRAMUSCULAR | Status: AC
  Filled 2016-06-25: qty 5

## 2016-06-25 MED ORDER — LACTATED RINGERS IV SOLN
INTRAVENOUS | Status: DC | PRN
  Administered 2016-06-25 (×2): via INTRAVENOUS

## 2016-06-25 MED ORDER — DEXTROSE-NACL 5-0.45 % IV SOLN: INTRAVENOUS | Status: DC

## 2016-06-25 MED ORDER — METOCLOPRAMIDE HCL 5 MG PO TABS: 5.0000 mg | ORAL_TABLET | Freq: Three times a day (TID) | ORAL | Status: DC | PRN

## 2016-06-25 MED ORDER — TRAMADOL HCL 50 MG PO TABS
50.0000 mg | ORAL_TABLET | Freq: Two times a day (BID) | ORAL | Status: DC | PRN
  Administered 2016-06-25 – 2016-06-27 (×3): 50 mg via ORAL
  Filled 2016-06-25 (×3): qty 1

## 2016-06-25 MED ORDER — HYDROMORPHONE HCL 2 MG/ML IJ SOLN: 1.0000 mg | INTRAMUSCULAR | Status: DC | PRN

## 2016-06-25 MED ORDER — OXYCODONE-ACETAMINOPHEN 5-325 MG PO TABS: 1.0000 | ORAL_TABLET | ORAL | 0 refills | Status: AC | PRN

## 2016-06-25 MED ORDER — PHENOL 1.4 % MT LIQD: 1.0000 | OROMUCOSAL | Status: DC | PRN

## 2016-06-25 MED ORDER — CELECOXIB 200 MG PO CAPS
200.0000 mg | ORAL_CAPSULE | Freq: Two times a day (BID) | ORAL | Status: DC
  Administered 2016-06-25 – 2016-06-27 (×5): 200 mg via ORAL
  Filled 2016-06-25 (×5): qty 1

## 2016-06-25 MED ORDER — SODIUM CHLORIDE 0.9 % IJ SOLN
INTRAMUSCULAR | Status: DC | PRN
  Administered 2016-06-25: 50 mL via INTRAVENOUS

## 2016-06-25 MED ORDER — BUPIVACAINE-EPINEPHRINE (PF) 0.25% -1:200000 IJ SOLN
INTRAMUSCULAR | Status: DC | PRN
  Administered 2016-06-25: 30 mL via PERINEURAL

## 2016-06-25 MED ORDER — AMLODIPINE BESYLATE 5 MG PO TABS
5.0000 mg | ORAL_TABLET | Freq: Every day | ORAL | Status: DC
  Administered 2016-06-26 – 2016-06-27 (×2): 5 mg via ORAL
  Filled 2016-06-25 (×2): qty 1

## 2016-06-25 MED ORDER — CEFAZOLIN SODIUM-DEXTROSE 2-4 GM/100ML-% IV SOLN
2.0000 g | INTRAVENOUS | Status: AC
Start: 2016-06-25 — End: 2016-06-25
  Administered 2016-06-25: 2 g via INTRAVENOUS

## 2016-06-25 MED ORDER — HYDROMORPHONE HCL 2 MG/ML IJ SOLN
1.0000 mg | INTRAMUSCULAR | Status: DC | PRN
  Administered 2016-06-25 – 2016-06-26 (×2): 1 mg via INTRAVENOUS
  Filled 2016-06-25 (×2): qty 1

## 2016-06-25 MED ORDER — LEVOTHYROXINE SODIUM 88 MCG PO TABS
88.0000 ug | ORAL_TABLET | Freq: Every day | ORAL | Status: DC
  Administered 2016-06-26 – 2016-06-27 (×2): 88 ug via ORAL
  Filled 2016-06-25 (×2): qty 1

## 2016-06-25 MED ORDER — TRANEXAMIC ACID 1000 MG/10ML IV SOLN
1000.0000 mg | INTRAVENOUS | Status: AC
  Administered 2016-06-25: 1000 mg via INTRAVENOUS
  Filled 2016-06-25: qty 10

## 2016-06-25 MED ORDER — PROPOFOL 10 MG/ML IV BOLUS
INTRAVENOUS | Status: DC | PRN
  Administered 2016-06-25 (×2): 20 mg via INTRAVENOUS
  Administered 2016-06-25: 10 mg via INTRAVENOUS

## 2016-06-25 MED ORDER — FENTANYL CITRATE (PF) 100 MCG/2ML IJ SOLN
25.0000 ug | INTRAMUSCULAR | Status: DC | PRN
Start: 2016-06-25 — End: 2016-06-25
  Administered 2016-06-25 (×3): 50 ug via INTRAVENOUS

## 2016-06-25 MED ORDER — ASPIRIN EC 325 MG PO TBEC
325.0000 mg | DELAYED_RELEASE_TABLET | Freq: Every day | ORAL | Status: DC
  Administered 2016-06-26 – 2016-06-27 (×2): 325 mg via ORAL
  Filled 2016-06-25 (×2): qty 1

## 2016-06-25 MED ORDER — METHOCARBAMOL 500 MG PO TABS
500.0000 mg | ORAL_TABLET | Freq: Four times a day (QID) | ORAL | Status: DC | PRN
  Administered 2016-06-25 – 2016-06-26 (×3): 500 mg via ORAL
  Filled 2016-06-25 (×3): qty 1

## 2016-06-25 MED ORDER — FLEET ENEMA 7-19 GM/118ML RE ENEM: 1.0000 | ENEMA | Freq: Once | RECTAL | Status: DC | PRN

## 2016-06-25 MED ORDER — FENTANYL CITRATE (PF) 100 MCG/2ML IJ SOLN
INTRAMUSCULAR | Status: DC | PRN
  Administered 2016-06-25 (×2): 50 ug via INTRAVENOUS

## 2016-06-25 MED ORDER — PHENYLEPHRINE HCL 10 MG/ML IJ SOLN
INTRAMUSCULAR | Status: DC | PRN
  Administered 2016-06-25 (×2): 80 ug via INTRAVENOUS

## 2016-06-25 MED ORDER — ONDANSETRON HCL 4 MG/2ML IJ SOLN
4.0000 mg | Freq: Four times a day (QID) | INTRAMUSCULAR | Status: DC | PRN
  Administered 2016-06-26: 4 mg via INTRAVENOUS
  Filled 2016-06-25: qty 2

## 2016-06-25 MED ORDER — PHENYLEPHRINE 40 MCG/ML (10ML) SYRINGE FOR IV PUSH (FOR BLOOD PRESSURE SUPPORT)
PREFILLED_SYRINGE | INTRAVENOUS | Status: AC
  Filled 2016-06-25: qty 10

## 2016-06-25 MED ORDER — ALUM & MAG HYDROXIDE-SIMETH 200-200-20 MG/5ML PO SUSP: 30.0000 mL | ORAL | Status: DC | PRN

## 2016-06-25 MED ORDER — SODIUM CHLORIDE 0.9 % IR SOLN
Status: DC | PRN
  Administered 2016-06-25: 3000 mL

## 2016-06-25 MED ORDER — METOCLOPRAMIDE HCL 5 MG/ML IJ SOLN: 5.0000 mg | Freq: Three times a day (TID) | INTRAMUSCULAR | Status: DC | PRN

## 2016-06-25 MED ORDER — ACETAMINOPHEN 650 MG RE SUPP: 650.0000 mg | Freq: Four times a day (QID) | RECTAL | Status: DC | PRN

## 2016-06-25 MED ORDER — SENNOSIDES-DOCUSATE SODIUM 8.6-50 MG PO TABS: 1.0000 | ORAL_TABLET | Freq: Every evening | ORAL | Status: DC | PRN

## 2016-06-25 MED ORDER — GABAPENTIN 300 MG PO CAPS
300.0000 mg | ORAL_CAPSULE | Freq: Three times a day (TID) | ORAL | Status: DC
  Administered 2016-06-25 – 2016-06-27 (×6): 300 mg via ORAL
  Filled 2016-06-25 (×6): qty 1

## 2016-06-25 MED ORDER — DIPHENHYDRAMINE HCL 12.5 MG/5ML PO ELIX: 12.5000 mg | ORAL_SOLUTION | ORAL | Status: DC | PRN

## 2016-06-25 MED ORDER — ESTRADIOL 0.1 MG/GM VA CREA
1.0000 | TOPICAL_CREAM | Freq: Every day | VAGINAL | Status: DC
  Filled 2016-06-25: qty 42.5

## 2016-06-25 MED ORDER — BUPIVACAINE LIPOSOME 1.3 % IJ SUSP
20.0000 mL | Freq: Once | INTRAMUSCULAR | Status: DC
  Filled 2016-06-25: qty 20

## 2016-06-25 MED ORDER — ONDANSETRON HCL 4 MG PO TABS
4.0000 mg | ORAL_TABLET | Freq: Four times a day (QID) | ORAL | Status: DC | PRN
  Filled 2016-06-25: qty 1

## 2016-06-25 MED ORDER — MENTHOL 3 MG MT LOZG: 1.0000 | LOZENGE | OROMUCOSAL | Status: DC | PRN

## 2016-06-25 MED ORDER — KCL IN DEXTROSE-NACL 20-5-0.45 MEQ/L-%-% IV SOLN: INTRAVENOUS | Status: DC

## 2016-06-25 MED ORDER — PROPOFOL 500 MG/50ML IV EMUL
INTRAVENOUS | Status: DC | PRN
  Administered 2016-06-25: 75 ug/kg/min via INTRAVENOUS

## 2016-06-25 MED ORDER — OXYCODONE HCL 5 MG/5ML PO SOLN: 5.0000 mg | Freq: Once | ORAL | Status: DC | PRN

## 2016-06-25 MED ORDER — BISACODYL 5 MG PO TBEC: 5.0000 mg | DELAYED_RELEASE_TABLET | Freq: Every day | ORAL | Status: DC | PRN

## 2016-06-25 SURGICAL SUPPLY — 51 items
BANDAGE ESMARK 6X9 LF (GAUZE/BANDAGES/DRESSINGS) ×1 IMPLANT
BLADE SAG 18X100X1.27 (BLADE) ×3 IMPLANT
BLADE SAW SGTL 13X75X1.27 (BLADE) ×3 IMPLANT
BNDG CMPR 9X6 STRL LF SNTH (GAUZE/BANDAGES/DRESSINGS) ×1
BNDG CMPR MED 10X6 ELC LF (GAUZE/BANDAGES/DRESSINGS) ×1
BNDG ELASTIC 6X10 VLCR STRL LF (GAUZE/BANDAGES/DRESSINGS) ×3 IMPLANT
BNDG ESMARK 6X9 LF (GAUZE/BANDAGES/DRESSINGS) ×3
BOWL SMART MIX CTS (DISPOSABLE) ×3 IMPLANT
CAPT KNEE TOTAL 3 ATTUNE ×2 IMPLANT
CEMENT HV SMART SET (Cement) ×6 IMPLANT
COVER SURGICAL LIGHT HANDLE (MISCELLANEOUS) ×3 IMPLANT
CUFF TOURNIQUET SINGLE 34IN LL (TOURNIQUET CUFF) ×3 IMPLANT
CUFF TOURNIQUET SINGLE 44IN (TOURNIQUET CUFF) IMPLANT
DRAPE EXTREMITY T 121X128X90 (DRAPE) ×3 IMPLANT
DRAPE U-SHAPE 47X51 STRL (DRAPES) ×3 IMPLANT
DRSG AQUACEL AG ADV 3.5X10 (GAUZE/BANDAGES/DRESSINGS) ×3 IMPLANT
DURAPREP 26ML APPLICATOR (WOUND CARE) ×3 IMPLANT
ELECT REM PT RETURN 9FT ADLT (ELECTROSURGICAL) ×3
ELECTRODE REM PT RTRN 9FT ADLT (ELECTROSURGICAL) ×1 IMPLANT
GLOVE BIO SURGEON STRL SZ7.5 (GLOVE) ×3 IMPLANT
GLOVE BIO SURGEON STRL SZ8.5 (GLOVE) ×5 IMPLANT
GLOVE BIOGEL PI IND STRL 8 (GLOVE) ×1 IMPLANT
GLOVE BIOGEL PI IND STRL 9 (GLOVE) ×1 IMPLANT
GLOVE BIOGEL PI INDICATOR 8 (GLOVE) ×2
GLOVE BIOGEL PI INDICATOR 9 (GLOVE) ×2
GOWN STRL REUS W/ TWL LRG LVL3 (GOWN DISPOSABLE) ×1 IMPLANT
GOWN STRL REUS W/ TWL XL LVL3 (GOWN DISPOSABLE) ×2 IMPLANT
GOWN STRL REUS W/TWL LRG LVL3 (GOWN DISPOSABLE) ×3
GOWN STRL REUS W/TWL XL LVL3 (GOWN DISPOSABLE) ×6
HANDPIECE INTERPULSE COAX TIP (DISPOSABLE) ×3
HOOD PEEL AWAY FACE SHEILD DIS (HOOD) ×6 IMPLANT
KIT BASIN OR (CUSTOM PROCEDURE TRAY) ×3 IMPLANT
KIT ROOM TURNOVER OR (KITS) ×3 IMPLANT
MANIFOLD NEPTUNE II (INSTRUMENTS) ×3 IMPLANT
NEEDLE 22X1 1/2 (OR ONLY) (NEEDLE) ×6 IMPLANT
NS IRRIG 1000ML POUR BTL (IV SOLUTION) ×3 IMPLANT
PACK TOTAL JOINT (CUSTOM PROCEDURE TRAY) ×3 IMPLANT
PAD ARMBOARD 7.5X6 YLW CONV (MISCELLANEOUS) ×6 IMPLANT
SET HNDPC FAN SPRY TIP SCT (DISPOSABLE) ×1 IMPLANT
SUT VIC AB 0 CT1 27 (SUTURE) ×3
SUT VIC AB 0 CT1 27XBRD ANBCTR (SUTURE) ×1 IMPLANT
SUT VIC AB 1 CTX 36 (SUTURE) ×3
SUT VIC AB 1 CTX36XBRD ANBCTR (SUTURE) ×1 IMPLANT
SUT VIC AB 2-0 CT1 27 (SUTURE) ×3
SUT VIC AB 2-0 CT1 TAPERPNT 27 (SUTURE) ×1 IMPLANT
SUT VIC AB 3-0 CT1 27 (SUTURE) ×3
SUT VIC AB 3-0 CT1 TAPERPNT 27 (SUTURE) ×1 IMPLANT
SYR CONTROL 10ML LL (SYRINGE) ×6 IMPLANT
TOWEL OR 17X24 6PK STRL BLUE (TOWEL DISPOSABLE) ×3 IMPLANT
TOWEL OR 17X26 10 PK STRL BLUE (TOWEL DISPOSABLE) ×3 IMPLANT
TRAY CATH 16FR W/PLASTIC CATH (SET/KITS/TRAYS/PACK) IMPLANT

## 2016-06-25 NOTE — Evaluation (Signed)
Physical Therapy Evaluation Patient Details Name: Marisa Hughes MRN: [id number] DOB: January 11, 1951 Today's Date: 06/25/2016   History of Present Illness  Patient is a 66 yo female admitted 06/25/16 now s/p Lt TKA.   PMH:  HTN, HLD, hypothyroid, arthritis, depression  Clinical Impression  Patient presents with problems listed below.  Will benefit from acute PT to maximize functional mobility prior to discharge home with family.  Recommend HHPT at d/c for continued therapy.    Follow Up Recommendations Home health PT;Supervision for mobility/OOB    Equipment Recommendations  Rolling [place] with 5" wheels;3in1 (PT)    Recommendations for Other Services       Precautions / Restrictions Precautions Precautions: Knee Precaution Booklet Issued: Yes (comment) Precaution Comments: Reviewed knee precautions Restrictions Weight Bearing Restrictions: Yes LLE Weight Bearing: Weight bearing as tolerated      Mobility  Bed Mobility Overal bed mobility: Needs Assistance Bed Mobility: Supine to Sit     Supine to sit: Min assist     General bed mobility comments: Verbal cues for technique.  Assist to move LLE off edge of bed.  Transfers Overall transfer level: Needs assistance Equipment used: Rolling [place] (2 wheeled) Transfers: Sit to/from Stand Sit to Stand: Min assist         General transfer comment: Verbal cues for hand placement and technique.  Assist to rise to standing and to steady.  Patient slightly lightheaded with standing.  Cleared with time.  Ambulation/Gait Ambulation/Gait assistance: Min assist Ambulation Distance (Feet): 4 Feet Assistive device: Rolling [place] (2 wheeled) Gait Pattern/deviations: Step-to pattern;Decreased stance time - left;Decreased step length - right;Decreased stride length;Decreased weight shift to left;Antalgic Gait velocity: decreased Gait velocity interpretation: Below normal speed for age/gender General Gait Details: Verbal cues for  safe use of RW and gait sequence.  Patient able to ambulate 4' and then became nauseated.  Turned to sit in chair.  Improved with time.  RN notified - in room.  Stairs            Wheelchair Mobility    Modified Rankin (Stroke Patients Only)       Balance                                             Pertinent Vitals/Pain Pain Assessment: 0-10 Pain Score: 4  Pain Location: Lt knee Pain Descriptors / Indicators: Aching;Sore Pain Intervention(s): Monitored during session;Repositioned;Ice applied    Home Living Family/patient expects to be discharged to:: Private residence Living Arrangements: Spouse/significant other;Children (Dtr 52 yo) Available Help at Discharge: Family;Available 24 hours/day Type of Home: House Home Access: Stairs to enter Entrance Stairs-Rails: None;Right;Left Entrance Stairs-Number of Steps: 3-no rail; 6-rails Home Layout: Two level;Able to live on main level with bedroom/bathroom Home Equipment: None      Prior Function Level of Independence: Independent         Comments: Driving     Hand Dominance        Extremity/Trunk Assessment   Upper Extremity Assessment Upper Extremity Assessment: Overall WFL for tasks assessed    Lower Extremity Assessment Lower Extremity Assessment: LLE deficits/detail LLE Deficits / Details: Decreased strength and ROM post-op       Communication   Communication: No difficulties  Cognition Arousal/Alertness: Awake/alert Behavior During Therapy: WFL for tasks assessed/performed Overall Cognitive Status: Within Functional Limits for tasks assessed  General Comments      Exercises Total Joint Exercises Ankle Circles/Pumps: AROM;Both;10 reps;Seated   Assessment/Plan    PT Assessment Patient needs continued PT services  PT Problem List Decreased strength;Decreased range of motion;Decreased activity tolerance;Decreased balance;Decreased  mobility;Decreased knowledge of use of DME;Decreased knowledge of precautions;Pain          PT Treatment Interventions DME instruction;Gait training;Stair training;Functional mobility training;Therapeutic activities;Therapeutic exercise;Patient/family education    PT Goals (Current goals can be found in the Care Plan section)  Acute Rehab PT Goals Patient Stated Goal: To go home PT Goal Formulation: With patient Time For Goal Achievement: 07/02/16 Potential to Achieve Goals: Good    Frequency 7X/week   Barriers to discharge        Co-evaluation               End of Session Equipment Utilized During Treatment: Gait belt Activity Tolerance: Patient limited by pain;Patient limited by fatigue (Limited by lightheadedness and nausea) Patient left: in chair;with call bell/phone within reach;with nursing/sitter in room Nurse Communication: Mobility status (Patient nauseated)         Time: [id number] PT Time Calculation (min) (ACUTE ONLY): 29 min   Charges:   PT Evaluation $PT Eval Moderate Complexity: 1 Procedure PT Treatments $Therapeutic Activity: 8-22 mins   PT G Codes:        Despina Pole 2016/07/02, 7:14 PM Carita Pian. Sanjuana Kava, [place] Pager [phone]

## 2016-06-25 NOTE — Anesthesia Preprocedure Evaluation (Addendum)
Anesthesia Evaluation  Patient identified by MRN, date of birth, ID band Patient awake    Reviewed: Allergy & Precautions, NPO status , Patient's Chart, lab work & pertinent test results  History of Anesthesia Complications Negative for: history of anesthetic complications  Airway Mallampati: II  TM Distance: >3 FB Neck ROM: Full    Dental  (+) Teeth Intact, Dental Advisory Given   Pulmonary    breath sounds clear to auscultation       Cardiovascular hypertension, Pt. on medications  Rhythm:Regular Rate:Normal     Neuro/Psych    GI/Hepatic   Endo/Other  Hypothyroidism   Renal/GU      Musculoskeletal  (+) Arthritis ,   Abdominal   Peds  Hematology   Anesthesia Other Findings   Reproductive/Obstetrics                           Anesthesia Physical Anesthesia Plan  ASA: III  Anesthesia Plan: MAC, Regional and Spinal   Post-op Pain Management:  Regional for Post-op pain   Induction: Intravenous  Airway Management Planned: LMA, Simple Face Mask and Natural Airway  Additional Equipment:   Intra-op Plan:   Post-operative Plan: Extubation in OR  Informed Consent: I have reviewed the patients History and Physical, chart, labs and discussed the procedure including the risks, benefits and alternatives for the proposed anesthesia with the patient or authorized representative who has indicated his/her understanding and acceptance.   Dental advisory given  Plan Discussed with: CRNA  Anesthesia Plan Comments:       Anesthesia Quick Evaluation

## 2016-06-25 NOTE — Interval H&P Note (Signed)
History and Physical Interval Note:  06/25/2016 7:09 AM  Marisa Hughes  has presented today for surgery, with the diagnosis of PRIMARY OSTEOARTHRITSIS OF THE LEFT KNEE. M17.12  The various methods of treatment have been discussed with the patient and family. After consideration of risks, benefits and other options for treatment, the patient has consented to  Procedure(s): TOTAL KNEE ARTHROPLASTY (Left) as a surgical intervention .  The patient's history has been reviewed, patient examined, no change in status, stable for surgery.  I have reviewed the patient's chart and labs.  Questions were answered to the patient's satisfaction.     Kerin Salen

## 2016-06-25 NOTE — Addendum Note (Signed)
Addendum  created 06/25/16 0956 by Jenne Campus, CRNA   Anesthesia Intra Meds edited

## 2016-06-25 NOTE — Progress Notes (Signed)
Orthopedic Tech Progress Note Patient Details:  Marisa Hughes 04/29/1951 [id number]  Ortho Devices Ortho Device/Splint Location: foot roll Ortho Device/Splint Interventions: Application   [place] Pink 06/25/2016, 11:57 AM

## 2016-06-25 NOTE — Op Note (Signed)
PATIENT ID:      Marisa Hughes  MRN:     [id number] DOB/AGE:    [birth year] / 66 y.o.       OPERATIVE REPORT    DATE OF PROCEDURE:  06/25/2016       PREOPERATIVE DIAGNOSIS:   PRIMARY OSTEOARTHRITSIS OF THE LEFT KNEE. M17.12      Estimated body mass index is 33.27 kg/m as calculated from the following:   Height as of this encounter: 5' 1.5" (1.562 m).   Weight as of this encounter: 81.2 kg (179 lb).                                                        POSTOPERATIVE DIAGNOSIS:   PRIMARY OSTEOARTHRITSIS OF THE LEFT KNEE. M17.12                                                                      PROCEDURE:  Procedure(s): TOTAL KNEE ARTHROPLASTY Using DepuyAttune RP implants #6LN Femur, #5Tibia, 5 mm Attune RP bearing, 35 Patella     SURGEON: Dorrine Montone J    ASSISTANT:   [employer] K[employer]   (Present and scrubbed throughout the case, critical for assistance with exposure, retraction, instrumentation, and closure.)         ANESTHESIA: Spinal, 20cc Exparel, 50cc 0.25% Marcaine  EBL: 300  FLUID REPLACEMENT: 1600 crystalloid  TOURNIQUET TIME: 37min  Drains: None  Tranexamic Acid: 1gm IV, 2gm topical  COMPLICATIONS:  None         INDICATIONS FOR PROCEDURE: The patient has  PRIMARY OSTEOARTHRITSIS OF THE LEFT KNEE. M17.12, Var deformities, XR shows bone on bone arthritis, lateral subluxation of tibia. Patient has failed all conservative measures including anti-inflammatory medicines, narcotics, attempts at  exercise and weight loss, cortisone injections and viscosupplementation.  Risks and benefits of surgery have been discussed, questions answered.   DESCRIPTION OF PROCEDURE: The patient identified by armband, received  IV antibiotics, in the holding area at [facility]. Patient taken to the operating room, appropriate anesthetic  monitors were attached, and Spinal anesthesia was  induced. Tourniquet  applied high to the operative thigh. Lateral post and foot positioner   applied to the table, the lower extremity was then prepped and draped  in usual sterile fashion from the toes to the tourniquet. Time-out procedure was performed. We began the operation, with the knee flexed 120 degrees, by making the anterior midline incision starting at handbreadth above the patella going over the patella 1 cm medial to and 4 cm distal to the tibial tubercle. Small bleeders in the skin and the  subcutaneous tissue identified and cauterized. Transverse retinaculum was incised and reflected medially and a medial parapatellar arthrotomy was accomplished. the patella was everted and theprepatellar fat pad resected. The superficial medial collateral  ligament was then elevated from anterior to posterior along the proximal  flare of the tibia and anterior half of the menisci resected. The knee was hyperflexed exposing bone on bone arthritis. Peripheral and notch osteophytes as well as the cruciate ligaments were then resected. We  continued to  work our way around posteriorly along the proximal tibia, and externally  rotated the tibia subluxing it out from underneath the femur. A McHale  retractor was placed through the notch and a lateral Hohmann retractor  placed, and we then drilled through the proximal tibia in line with the  axis of the tibia followed by an intramedullary guide rod and 2-degree  posterior slope cutting guide. The tibial cutting guide, 3 degree posterior sloped, was pinned into place allowing resection of 5 mm of bone medially and 11 mm of bone laterally. Satisfied with the tibial resection, we then  entered the distal femur 2 mm anterior to the PCL origin with the  intramedullary guide rod and applied the distal femoral cutting guide  set at 9 mm, with 5 degrees of valgus. This was pinned along the  epicondylar axis. At this point, the distal femoral cut was accomplished without difficulty. We then sized for a #6N femoral component and pinned the guide in 3 degrees  of external rotation. The chamfer cutting guide was pinned into place. The anterior, posterior, and chamfer cuts were accomplished without difficulty followed by  the Attune RP box cutting guide and the box cut. We also removed posterior osteophytes from the posterior femoral condyles. At this  time, the knee was brought into full extension. We checked our  extension and flexion gaps and found them symmetric for a 5 mm bearing. Distracting in extension with a lamina spreader, the posterior horns of the menisci were removed, and Exparel, diluted to 60 cc, with 20cc NS, and 20cc 0.5% Marcaine,was injected into the capsule and synovium of the knee. The posterior patella cut was accomplished with the 9.5 mm Attune cutting guide, sized for a 78mm dome, and the fixation pegs drilled.The knee  was then once again hyperflexed exposing the proximal tibia. We sized for a # 5 tibial base plate, applied the smokestack and the conical reamer followed by the the Delta fin keel punch. We then hammered into place the Attune RP trial femoral component, drilled the lugs, inserted a  5 mm trial bearing, trial patellar button, and took the knee through range of motion from 0-130 degrees. No thumb pressure was required for patellar Tracking. At this point, the limb was wrapped with an Esmarch bandage and the tourniquet inflated to 350 mmHg. All trial components were removed, mating surfaces irrigated with pulse lavage, and dried with suction and sponges. 10 cc of the Exparel solution was applied to the cancellus bone of the patella distal femur and proximal tibia.  After waiting 1 minute, the bony surfaces were again, dried with sponges. A double batch of DePuy HV cement with 1500 mg of Zinacef was mixed and applied to all bony metallic mating surfaces except for the posterior condyles of the femur itself. In order, we hammered into place the tibial tray and removed excess cement, the femoral component and removed excess cement.  The final Attune RP bearing  was inserted, and the knee brought to full extension with compression.  The patellar button was clamped into place, and excess cement  removed. While the cement cured the wound was irrigated out with normal saline solution pulse lavage. Ligament stability and patellar tracking were checked and found to be excellent. The parapatellar arthrotomy was closed with  running #1 Vicryl suture. The subcutaneous tissue with 0 and 2-0 undyed  Vicryl suture, and the skin with running 3-0 SQ vicryl. A dressing of Xeroform,  4 x 4, dressing  sponges, Webril, and Ace wrap applied. The patient  awakened, and taken to recovery room without difficulty.   Myriah Boggus J 06/25/2016, 9:03 AM

## 2016-06-25 NOTE — Discharge Instructions (Signed)

## 2016-06-25 NOTE — Progress Notes (Signed)
Pt I and O cath for 1 liter after bladder scan

## 2016-06-25 NOTE — Anesthesia Postprocedure Evaluation (Signed)
Anesthesia Post Note  Patient: Marisa Hughes  Procedure(s) Performed: Procedure(s) (LRB): TOTAL KNEE ARTHROPLASTY (Left)  Patient location during evaluation: PACU Anesthesia Type: Regional and MAC Level of consciousness: awake and awake and alert Pain management: pain level controlled Vital Signs Assessment: post-procedure vital signs reviewed and stable Respiratory status: spontaneous breathing, nonlabored ventilation, respiratory function stable and patient connected to nasal cannula oxygen Cardiovascular status: blood pressure returned to baseline Postop Assessment: no headache and spinal receding Anesthetic complications: no       Last Vitals:  Vitals:   06/25/16 0930 06/25/16 0939  BP:  112/60  Pulse: 72 (!) 57  Resp: (!) 24 11  Temp: (!) 35 C     Last Pain:  Vitals:   06/25/16 0635  TempSrc:   PainSc: 1                  Yuli Lanigan COKER

## 2016-06-25 NOTE — Anesthesia Procedure Notes (Signed)
Anesthesia Regional Block:  Adductor canal block  Pre-Anesthetic Checklist: ,, timeout performed, Correct Patient, Correct Site, Correct Laterality, Correct Procedure, Correct Position, site marked, Risks and benefits discussed,  Surgical consent,  Pre-op evaluation,  At surgeon's request and post-op pain management  Laterality: Left  Prep: chloraprep       Needles:  Injection technique: Single-shot  Needle Type: Echogenic Stimulator Needle      Needle Gauge: 22 and 22 G    Additional Needles:  Procedures: ultrasound guided (picture in chart) Adductor canal block Narrative:  Start time: 06/25/2016 7:10 AM End time: 06/25/2016 7:15 AM Injection made incrementally with aspirations every 5 mL.  Performed by: Personally  Anesthesiologist: Ryna Beckstrom  Additional Notes: 20 cc 0.75% Naropin injected easily

## 2016-06-25 NOTE — Anesthesia Procedure Notes (Signed)
Spinal  Patient location during procedure: OR Start time: 06/25/2016 7:30 AM End time: 06/25/2016 7:35 AM Staffing Performed: anesthesiologist  Preanesthetic Checklist Completed: patient identified, site marked, surgical consent, pre-op evaluation, IV checked, risks and benefits discussed and monitors and equipment checked Spinal Block Patient position: sitting Prep: Betadine Patient monitoring: heart rate, cardiac monitor, continuous pulse ox and blood pressure Approach: right paramedian Location: L3-4 Injection technique: single-shot Needle Needle type: Tuohy  Needle gauge: 22 G Needle length: 9 cm Needle insertion depth: 7 cm Assessment Sensory level: T6 Additional Notes 12 cc 0.75% Bupivacaine injected easily

## 2016-06-25 NOTE — Transfer of Care (Signed)
Immediate Anesthesia Transfer of Care Note  Patient: [name]  Procedure(s) Performed: Procedure(s): TOTAL KNEE ARTHROPLASTY (Left)  Patient Location: PACU  Anesthesia Type:MAC and Spinal  Level of Consciousness: awake, oriented and patient cooperative  Airway & Oxygen Therapy: Patient Spontanous Breathing and Patient connected to face mask oxygen  Post-op Assessment: Report given to RN and Post -op Vital signs reviewed and stable  Post vital signs: Reviewed  Last Vitals:  Vitals:   06/25/16 0559  BP: (!) 148/52  Pulse: 73  Resp: 20  Temp: 37 C    Last Pain:  Vitals:   06/25/16 0635  TempSrc:   PainSc: 1       Patients Stated Pain Goal: 0 (50/35/46 5681)  Complications: No apparent anesthesia complications

## 2016-06-26 ENCOUNTER — Encounter (HOSPITAL_COMMUNITY): Payer: Self-pay | Admitting: Orthopedic Surgery

## 2016-06-26 LAB — BASIC METABOLIC PANEL
Anion gap: 5 (ref 5–15)
BUN: 15 mg/dL (ref 6–20)
CALCIUM: 9.3 mg/dL (ref 8.9–10.3)
CO2: 26 mmol/L (ref 22–32)
CREATININE: 0.89 mg/dL (ref 0.44–1.00)
Chloride: 104 mmol/L (ref 101–111)
GFR calc Af Amer: >60 mL/min (ref >60)
GLUCOSE: 129 mg/dL — AB (ref 65–99)
Potassium: 4.2 mmol/L (ref 3.5–5.1)
Sodium: 135 mmol/L (ref 135–145)

## 2016-06-26 LAB — CBC
HCT: 34.5 % — ABNORMAL LOW (ref 36.0–46.0)
Hemoglobin: 11.3 g/dL — ABNORMAL LOW (ref 12.0–15.0)
MCH: 28.6 pg (ref 26.0–34.0)
MCHC: 32.8 g/dL (ref 30.0–36.0)
MCV: 87.3 fL (ref 78.0–100.0)
PLATELETS: 315 10*3/uL (ref 150–400)
RBC: 3.95 MIL/uL (ref 3.87–5.11)
RDW: 14 % (ref 11.5–15.5)
WBC: 10.9 10*3/uL — AB (ref 4.0–10.5)

## 2016-06-26 NOTE — Care Management Note (Signed)
Case Management Note  Patient Details  Name: Marisa Hughes MRN: [id number] Date of Birth: 09-20-1950  Subjective/Objective: 66 yr old female s/p left total knee arthroplasty.                    Action/Plan: Patient was preoperatively setup with [facility], no changes. She will have family support at discharge.     Expected Discharge Date:    06/27/16               Expected Discharge Plan:  [place]  In-House Referral:  NA  Discharge planning Services  CM Consult  Post Acute Care Choice:  Durable Medical Equipment, Home Health Choice offered to:  Patient  DME Arranged:  3-N-1, [place] rolling, CPM DME Agency:  [employer]  HH Arranged:  PT [place]:  [facility] [employer]  Status of Service:  Completed, signed off  If discussed at [employer], dates discussed:    Additional Comments:  Ninfa Meeker, RN 06/26/2016, 3:56 PM

## 2016-06-26 NOTE — Progress Notes (Signed)
Physical Therapy Treatment Patient Details Name: Marisa Hughes MRN: [id number] DOB: 1951/02/26 Today's Date: 06/26/2016    History of Present Illness Patient is a 66 yo female admitted 06/25/16 now s/p Lt TKA.   PMH:  HTN, HLD, hypothyroid, arthritis, depression    PT Comments    Patient is making progress toward mobility goals. Limited by nausea/lightheadedness however motivated to participate. Continue to progress as tolerated with anticipated d/c home with HHPT.   Follow Up Recommendations  Home health PT;Supervision for mobility/OOB     Equipment Recommendations  Rolling V[place] with 5" wheels;3in1 (PT)    Recommendations for Other Services       Precautions / Restrictions Precautions Precautions: Knee Precaution Booklet Issued: Yes (comment) Precaution Comments: Reviewed knee precautions Restrictions Weight Bearing Restrictions: Yes LLE Weight Bearing: Weight bearing as tolerated    Mobility  Bed Mobility               General bed mobility comments: pt OOB in chair upon arrival  Transfers Overall transfer level: Needs assistance Equipment used: Rolling [place]in (2 wheeled) Transfers: Sit to/from Stand Sit to Stand: Min assist         General transfer comment: cues for safe hand placement and technqiue; assist to power up into standing  Ambulation/Gait Ambulation/Gait assistance: Min assist Ambulation Distance (Feet): 80 Feet Assistive device: Rolling Huge (2 wheeled) Gait Pattern/deviations: Step-to pattern;Decreased stance time - left;Decreased step length - right;Decreased stride length;Decreased weight shift to left;Antalgic Gait velocity: decreased   General Gait Details: cues for sequencing and proximity of RW; pt with step to pattern but progressing toward step through pattern    Stairs            Wheelchair Mobility    Modified Rankin (Stroke Patients Only)       Balance                                     Cognition Arousal/Alertness: Awake/alert Behavior During Therapy: WFL for tasks assessed/performed Overall Cognitive Status: Within Functional Limits for tasks assessed                      Exercises Total Joint Exercises Quad Sets: AROM;Left;10 reps Short Arc Quad: AROM;AAROM;Left;10 reps Heel Slides: AAROM;Left;10 reps;AROM Hip ABduction/ADduction: AAROM;Left;10 reps Straight Leg Raises: AAROM;Left;5 reps Long Arc Quad: AROM;Left;10 reps Knee Flexion: AROM;Left;5 reps;Seated Goniometric ROM: ~80 of flexion in sitting    General Comments        Pertinent Vitals/Pain Pain Assessment: Faces Faces Pain Scale: Hurts little more Pain Location: Lt knee Pain Descriptors / Indicators: Aching;Sore Pain Intervention(s): Limited activity within patient's tolerance;Monitored during session;Premedicated before session;Repositioned    Home Living Family/patient expects to be discharged to:: Private residence Living Arrangements: Spouse/significant other;Children Available Help at Discharge: Family;Available 24 hours/day Type of Home: House Home Access: Stairs to enter Entrance Stairs-Rails: None;Right;Left Home Layout: Two level;Able to live on main level with bedroom/bathroom Home Equipment: None Additional Comments: RW and 3 in 1 delivered to room    Prior Function Level of Independence: Independent      Comments: Driving   PT Goals (current goals can now be found in the care plan section) Acute Rehab PT Goals Patient Stated Goal: To go home PT Goal Formulation: With patient Time For Goal Achievement: 07/02/16 Potential to Achieve Goals: Good Progress towards PT goals: Progressing toward goals    Frequency  7X/week      PT Plan Current plan remains appropriate    Co-evaluation             End of Session Equipment Utilized During Treatment: Gait belt Activity Tolerance: Patient tolerated treatment well;Other (comment) (Limited by lightheadedness  and nausea) Patient left: in chair;with call bell/phone within reach;with nursing/sitter in room;with family/visitor present     Time: [id number] PT Time Calculation (min) (ACUTE ONLY): 44 min  Charges:  $Gait Training: 23-37 mins $Therapeutic Exercise: 8-22 mins                    G Codes:      Salina April, PTA Pager: [phone]   06/26/2016, 2:03 PM

## 2016-06-26 NOTE — Progress Notes (Signed)
Physical Therapy Treatment Patient Details Name: Marisa Hughes MRN: [id number] DOB: 11/14/1950 Today's Date: 06/26/2016    History of Present Illness Patient is a 66 yo female admitted 06/25/16 now s/p Lt TKA.   PMH:  HTN, HLD, hypothyroid, arthritis, depression    PT Comments    Patient reported feeling fatigued but not nauseated this session. Pt tolerated increased gait distance and stair training. Husband present and actively participated. Current plan remains appropriate.   Follow Up Recommendations  Home health PT;Supervision for mobility/OOB     Equipment Recommendations  Rolling [place] with 5" wheels;3in1 (PT)    Recommendations for Other Services       Precautions / Restrictions Precautions Precautions: Knee Precaution Booklet Issued: Yes (comment) Precaution Comments: Reviewed knee precautions Restrictions Weight Bearing Restrictions: Yes LLE Weight Bearing: Weight bearing as tolerated    Mobility  Bed Mobility Overal bed mobility: Needs Assistance Bed Mobility: Sit to Supine       Sit to supine: Min assist   General bed mobility comments: pt OOB in chair upon arrival; assist to elevate L LE into bed and cues for technique  Transfers Overall transfer level: Needs assistance Equipment used: Rolling Theilen (2 wheeled) Transfers: Sit to/from Stand Sit to Stand: Min guard         General transfer comment: min guard for safety; carry over of safe hand placement  Ambulation/Gait Ambulation/Gait assistance: Min assist Ambulation Distance (Feet): 100 Feet Assistive device: Rolling Radel (2 wheeled) Gait Pattern/deviations: Step-to pattern;Decreased stance time - left;Decreased step length - right;Decreased stride length;Decreased weight shift to left;Antalgic;Step-through pattern Gait velocity: decreased   General Gait Details: carry over of sequencing; cues for posture and R step length/heel strike   Stairs Stairs: Yes   Stair Management: No  rails;Step to pattern;Backwards;With Marando Number of Stairs:  (2 steps X2) General stair comments: cues for sequencing and technique with demonstration prior to practicing; husband stabilized RW; handout given  Wheelchair Mobility    Modified Rankin (Stroke Patients Only)       Balance                                    Cognition Arousal/Alertness: Awake/alert Behavior During Therapy: WFL for tasks assessed/performed Overall Cognitive Status: Within Functional Limits for tasks assessed                      Exercises Total Joint Exercises Quad Sets: AROM;Left;10 reps Short Arc Quad: AROM;AAROM;Left;10 reps Heel Slides: AAROM;Left;10 reps;AROM Hip ABduction/ADduction: AAROM;Left;10 reps Straight Leg Raises: AAROM;Left;5 reps Long Arc Quad: AROM;Left;10 reps Knee Flexion: AROM;Left;5 reps;Seated Goniometric ROM: ~80 of flexion in sitting    General Comments        Pertinent Vitals/Pain Pain Assessment: Faces Faces Pain Scale: Hurts little more Pain Location: Lt knee Pain Descriptors / Indicators: Aching;Sore;Guarding Pain Intervention(s): Limited activity within patient's tolerance;Monitored during session;Repositioned;Patient requesting pain meds-RN notified;Ice applied    Home Living Family/patient expects to be discharged to:: Private residence Living Arrangements: Spouse/significant other;Children Available Help at Discharge: Family;Available 24 hours/day Type of Home: House Home Access: Stairs to enter Entrance Stairs-Rails: None;Right;Left Home Layout: Two level;Able to live on main level with bedroom/bathroom Home Equipment: None Additional Comments: RW and 3 in 1 delivered to room    Prior Function Level of Independence: Independent      Comments: Driving   PT Goals (current goals can now be found  in the care plan section) Acute Rehab PT Goals Patient Stated Goal: To go home PT Goal Formulation: With patient Time For Goal  Achievement: 07/02/16 Potential to Achieve Goals: Good Progress towards PT goals: Progressing toward goals    Frequency    7X/week      PT Plan Current plan remains appropriate    Co-evaluation             End of Session Equipment Utilized During Treatment: Gait belt Activity Tolerance: Patient tolerated treatment well Patient left: with call bell/phone within reach;with family/visitor present;in bed     Time: 1425-1457 PT Time Calculation (min) (ACUTE ONLY): 32 min  Charges:  $Gait Training: 8-22 mins $Therapeutic Activity: 8-22 mins                    G Codes:      Salina April, PTA Pager: [phone]   06/26/2016, 3:03 PM

## 2016-06-26 NOTE — Progress Notes (Signed)
PATIENT ID: Marisa Hughes  MRN: [id number]  DOB/AGE:  1951/03/22 / 66 y.o.  1 Day Post-Op Procedure(s) (LRB): TOTAL KNEE ARTHROPLASTY (Left)    PROGRESS NOTE Subjective: Patient is alert, oriented, no Nausea, no Vomiting, yes passing gas. Taking PO well. Denies SOB, Chest or Calf Pain. Using Incentive Spirometer, PAS in place. Ambulate Walked in room last night, Patient reports pain as 2/10 .    Objective: Vital signs in last 24 hours: Vitals:   06/25/16 1345 06/25/16 1515 06/25/16 2200 06/26/16 0515  BP: 125/73  (!) 108/45 (!) 109/46  Pulse: 66  (!) 58 71  Resp: 14  15 16   Temp:  97 F (36.1 C) 97.9 F (36.6 C) 97.5 F (36.4 C)  TempSrc:   Oral Oral  SpO2:   95% 100%  Weight:      Height:          Intake/Output from previous day: I/O last 3 completed shifts: In: 1400 [I.V.:1400] Out: 1976 [Urine:1901; Blood:75]   Intake/Output this shift: No intake/output data recorded.   LABORATORY DATA:  Recent Labs  06/26/16 0335  WBC 10.9*  HGB 11.3*  HCT 34.5*  PLT 315  NA 135  K 4.2  CL 104  CO2 26  BUN 15  CREATININE 0.89  GLUCOSE 129*  CALCIUM 9.3    Examination: Neurologically intact ABD soft Neurovascular intact Sensation intact distally Intact pulses distally Dorsiflexion/Plantar flexion intact Incision: dressing C/D/I No cellulitis present Compartment soft}  Assessment:   1 Day Post-Op Procedure(s) (LRB): TOTAL KNEE ARTHROPLASTY (Left) ADDITIONAL DIAGNOSIS: Expected Acute Blood Loss Anemia, Hypothyroidism  Plan: PT/OT WBAT, AROM and PROM  DVT Prophylaxis:  SCDx72hrs, ASA 325 mg BID x 2 weeks DISCHARGE PLAN: Home, today If patient passes PT DISCHARGE NEEDS: HHPT, [place] and 3-in-1 comode seat     Shalane Florendo J 06/26/2016, 7:53 AM Patient ID: Marisa Hughes, female   DOB: April 09, 1951, 66 y.o.   MRN: [id number]

## 2016-06-26 NOTE — Evaluation (Signed)
Occupational Therapy Evaluation Patient Details Name: Marisa Hughes MRN: [id number] DOB: 1951-02-21 Today's Date: 06/26/2016    History of Present Illness Patient is a 66 yo female admitted 06/25/16 now s/p Lt TKA.   PMH:  HTN, HLD, hypothyroid, arthritis, depression   Clinical Impression   Pt was independent prior to admission. Currently requiring min assist for mobility and ADL. Educated pt and daughter in compensatory strategies for LB ADL, use of AE, multiple uses of 3 in 1 and shower transfer. Pt and daughter verbalizing understanding. No further OT needs.    Follow Up Recommendations  No OT follow up    Equipment Recommendations  3 in 1 bedside commode    Recommendations for Other Services       Precautions / Restrictions Precautions Precautions: Knee;Fall Restrictions Weight Bearing Restrictions: Yes LLE Weight Bearing: Weight bearing as tolerated      Mobility Bed Mobility               General bed mobility comments: in chair  Transfers Overall transfer level: Needs assistance Equipment used: Rolling [place] (2 wheeled) Transfers: Sit to/from Stand Sit to Stand: Min assist         General transfer comment: Verbal cues for hand placement and technique.  Assist to rise to standing and to steady.    Balance                                            ADL Overall ADL's : Needs assistance/impaired Eating/Feeding: Independent;Sitting   Grooming: Wash/dry hands;Standing;Min guard   Upper Body Bathing: Set up;Sitting   Lower Body Bathing: Minimal assistance;Sit to/from stand Lower Body Bathing Details (indicate cue type and reason): educated in use of long handled bath sponge Upper Body Dressing : Set up;Sitting   Lower Body Dressing: Minimal assistance;Sit to/from stand Lower Body Dressing Details (indicate cue type and reason): educated in use of AE and compensatory strategies, safe footwear Toilet Transfer: Minimal  assistance;RW;Ambulation;BSC (over toilet)   Toileting- Clothing Manipulation and Hygiene: Minimal assistance     Tub/Shower Transfer Details (indicate cue type and reason): instructed in technique  Functional mobility during ADLs: Minimal assistance;Rolling [place]winner       Vision     Perception     Praxis      Pertinent Vitals/Pain Pain Assessment: Faces Faces Pain Scale: Hurts even more Pain Location: Lt knee Pain Descriptors / Indicators: Aching;Sore Pain Intervention(s): Ice applied;Premedicated before session;Monitored during session     Hand Dominance Right   Extremity/Trunk Assessment Upper Extremity Assessment Upper Extremity Assessment: Overall WFL for tasks assessed   Lower Extremity Assessment Lower Extremity Assessment: Defer to PT evaluation       Communication Communication Communication: No difficulties   Cognition Arousal/Alertness: Awake/alert Behavior During Therapy: WFL for tasks assessed/performed Overall Cognitive Status: Within Functional Limits for tasks assessed                     General Comments       Exercises       Shoulder Instructions      Home Living Family/patient expects to be discharged to:: Private residence Living Arrangements: Spouse/significant other;Children Available Help at Discharge: Family;Available 24 hours/day Type of Home: House Home Access: Stairs to enter [employer] of Steps: 3-no rail; 6-rails Entrance Stairs-Rails: None;Right;Left Home Layout: Two level;Able to live on main level  with bedroom/bathroom     Bathroom Shower/Tub: Occupational psychologist: Standard     Home Equipment: None   Additional Comments: RW and 3 in 1 delivered to room      Prior Functioning/Environment Level of Independence: Independent        Comments: Driving        OT Problem List: Decreased range of motion;Impaired balance (sitting and/or standing);Decreased knowledge of use of DME or  AE;Pain   OT Treatment/Interventions:      OT Goals(Current goals can be found in the care plan section) Acute Rehab OT Goals Patient Stated Goal: To go home  OT Frequency:     Barriers to D/C:            Co-evaluation              End of Session Equipment Utilized During Treatment: Gait belt;Rolling Stebbins  Activity Tolerance: Patient tolerated treatment well Patient left: in chair;with call bell/phone within reach;with family/visitor present   Time: 1210-1234 OT Time Calculation (min): 24 min Charges:  OT General Charges $OT Visit: 1 Procedure OT Evaluation $OT Eval Low Complexity: 1 Procedure OT Treatments $Self Care/Home Management : 8-22 mins G-Codes:    Malka So 06/26/2016, 1:22 PM  [phone]

## 2016-06-27 LAB — CBC
HCT: 31.8 % — ABNORMAL LOW (ref 36.0–46.0)
HEMOGLOBIN: 10.4 g/dL — AB (ref 12.0–15.0)
MCH: 28.5 pg (ref 26.0–34.0)
MCHC: 32.7 g/dL (ref 30.0–36.0)
MCV: 87.1 fL (ref 78.0–100.0)
PLATELETS: 302 10*3/uL (ref 150–400)
RBC: 3.65 MIL/uL — AB (ref 3.87–5.11)
RDW: 13.8 % (ref 11.5–15.5)
WBC: 8.1 10*3/uL (ref 4.0–10.5)

## 2016-06-27 NOTE — Progress Notes (Signed)
Pt discharge instructions and prescriptions given, pt verbalized understanding.  VSS.  Denies pain at this time.  Pt to be discharged home by private vehicle.

## 2016-06-27 NOTE — Progress Notes (Signed)
Physical Therapy Treatment Patient Details Name: Marisa Hughes MRN: [id number] DOB: 02-03-1951 Today's Date: 06/27/2016    History of Present Illness Patient is a 66 yo female admitted 06/25/16 now s/p Lt TKA.   PMH:  HTN, HLD, hypothyroid, arthritis, depression    PT Comments    Patient reported feeling much better today and with no nausea. Pt is progressing well toward mobility goals and demonstrated increased activity tolerance, ROM, and improved gait mechanics this session. Reviewed stairs, HEP, and precautions. Current plan remains appropriate.   Follow Up Recommendations  Home health PT     Equipment Recommendations  Rolling Baltes with 5" wheels;3in1 (PT)    Recommendations for Other Services       Precautions / Restrictions Precautions Precautions: Knee Precaution Booklet Issued: Yes (comment) Precaution Comments: Reviewed knee precautions Restrictions Weight Bearing Restrictions: Yes LLE Weight Bearing: Weight bearing as tolerated    Mobility  Bed Mobility Overal bed mobility: Needs Assistance Bed Mobility: Sit to Supine;Supine to Sit     Supine to sit: Min assist Sit to supine: Supervision   General bed mobility comments: bed level elevated and use of step to get onto bed to simulate home; cues for technique and safety; pt with carry over of stair training from previous session; assist to elevate L LE into bed   Transfers Overall transfer level: Needs assistance Equipment used: Rolling Yakubov (2 wheeled) Transfers: Sit to/from Stand Sit to Stand: Supervision         General transfer comment: supervision for safety; carry over of safe hand placement and technique  Ambulation/Gait Ambulation/Gait assistance: Supervision Ambulation Distance (Feet): 150 Feet Assistive device: Rolling Bille (2 wheeled) Gait Pattern/deviations: Decreased stance time - left;Decreased stride length;Decreased weight shift to left;Step-through pattern Gait velocity:  decreased   General Gait Details: pt with improved WB on L LE and with improved gait mechanics this session; cues for increased L knee flexion during stance phase   Stairs         General stair comments: practiced one step to get up onto high bed with min guard  Wheelchair Mobility    Modified Rankin (Stroke Patients Only)       Balance                                    Cognition Arousal/Alertness: Awake/alert Behavior During Therapy: WFL for tasks assessed/performed Overall Cognitive Status: Within Functional Limits for tasks assessed                      Exercises Total Joint Exercises Quad Sets: AROM;Both;10 reps Heel Slides: AROM;Left;10 reps Long Arc Quad: AROM;Left;10 reps Knee Flexion: AROM;Left;5 reps;Seated;Other (comment) (10 sec holds) Goniometric ROM: 5-90    General Comments        Pertinent Vitals/Pain Pain Assessment: 0-10 Pain Score: 5  Pain Location: Lt knee Pain Descriptors / Indicators: Sore Pain Intervention(s): Limited activity within patient's tolerance;Monitored during session;Premedicated before session;Repositioned    Home Living                      Prior Function            PT Goals (current goals can now be found in the care plan section) Acute Rehab PT Goals Patient Stated Goal: To go home PT Goal Formulation: With patient Time For Goal Achievement: 07/02/16 Potential to Achieve Goals: Good Progress towards  PT goals: Progressing toward goals    Frequency    7X/week      PT Plan Current plan remains appropriate    Co-evaluation             End of Session Equipment Utilized During Treatment: Gait belt Activity Tolerance: Patient tolerated treatment well Patient left: with call bell/phone within reach;with family/visitor present;in chair     Time: 0929-1010 PT Time Calculation (min) (ACUTE ONLY): 41 min  Charges:  $Gait Training: 8-22 mins $Therapeutic Exercise: 8-22  mins $Therapeutic Activity: 8-22 mins                    G Codes:      Salina April, PTA Pager: [phone]   06/27/2016, 10:18 AM

## 2016-06-27 NOTE — Discharge Summary (Signed)
Patient ID: Marisa Hughes MRN: [id number] DOB/AGE: Jul 14, 1950 66 y.o.  Admit date: 06/25/2016 Discharge date: 06/27/2016  Admission Diagnoses:  Principal Problem:   Primary osteoarthritis of left knee Active Problems:   Primary localized osteoarthritis of left knee   Discharge Diagnoses:  Same  Past Medical History:  Diagnosis Date  . Arthritis   . Depression    situational  . Glaucoma   . Headache    hx  . Heart murmur    years ago- not hear recently  . Hyperlipidemia   . Hypertension   . Hypothyroidism     Surgeries: Procedure(s): TOTAL KNEE ARTHROPLASTY on 06/25/2016   Consultants:   Discharged Condition: Improved  Hospital Course: Marisa Hughes is an 66 y.o. female who was admitted 06/25/2016 for operative treatment ofPrimary osteoarthritis of left knee. Patient has severe unremitting pain that affects sleep, daily activities, and work/hobbies. After pre-op clearance the patient was taken to the operating room on 06/25/2016 and underwent  Procedure(s): TOTAL KNEE ARTHROPLASTY.    Patient was given perioperative antibiotics: Anti-infectives    Start     Dose/Rate Route Frequency Ordered Stop   06/25/16 0628  ceFAZolin (ANCEF) 2-4 GM/100ML-% IVPB    Comments:  Ronnald Ramp, Tom[place]   : cabinet override      06/25/16 0628 06/25/16 0740   06/25/16 0625  ceFAZolin (ANCEF) IVPB 2g/100 mL premix     2 g 200 mL/hr over 30 Minutes Intravenous On call to O.R. 06/25/16 [id number] 06/25/16 0755       Patient was given sequential compression devices, early ambulation, and chemoprophylaxis to prevent DVT.  Patient benefited maximally from hospital stay and there were no complications.    Recent vital signs: Patient Vitals for the past 24 hrs:  BP Temp Temp src Pulse Resp SpO2  06/27/16 0401 (!) 122/54 98.1 F (36.7 C) Oral 71 16 100 %  06/26/16 2143 (!) 119/54 98.1 F (36.7 C) Oral 71 16 95 %  06/26/16 1347 121/64 97.6 F (36.4 C) Oral 73 16 99 %     Recent laboratory  studies:  Recent Labs  06/26/16 0335 06/27/16 0458  WBC 10.9* 8.1  HGB 11.3* 10.4*  HCT 34.5* 31.8*  PLT 315 302  NA 135  --   K 4.2  --   CL 104  --   CO2 26  --   BUN 15  --   CREATININE 0.89  --   GLUCOSE 129*  --   CALCIUM 9.3  --      Discharge Medications:   Allergies as of 06/27/2016   No Known Allergies     Medication List    STOP taking these medications   doxylamine (Sleep) 25 MG tablet Commonly known as:  UNISOM   meloxicam 15 MG tablet Commonly known as:  MOBIC     TAKE these medications   amLODipine 5 MG tablet Commonly known as:  NORVASC Take 5 mg by mouth daily.   aspirin EC 325 MG tablet Take 1 tablet (325 mg total) by mouth 2 (two) times daily.   estradiol 0.1 MG/GM vaginal cream Commonly known as:  ESTRACE Place 1 Applicatorful vaginally at bedtime.   latanoprost 0.005 % ophthalmic solution Commonly known as:  XALATAN Place 1 drop into both eyes at bedtime.   levothyroxine 88 MCG tablet Commonly known as:  SYNTHROID, LEVOTHROID Take 88 mcg by mouth daily.   MEGARED OMEGA-3 KRILL OIL 500 MG Caps Take 1 capsule by mouth daily.   multivitamin  with minerals tablet Take 1 tablet by mouth every other day.   HAIR SKIN AND NAILS FORMULA PO Take 1 tablet by mouth daily.   oxyCODONE-acetaminophen 5-325 MG tablet Commonly known as:  ROXICET Take 1 tablet by mouth every 4 (four) hours as needed.   rosuvastatin 20 MG tablet Commonly known as:  CRESTOR Take 20 mg by mouth every evening.   spironolactone 50 MG tablet Commonly known as:  ALDACTONE Take 50 mg by mouth daily.   tiZANidine 2 MG tablet Commonly known as:  ZANAFLEX Take 1 tablet (2 mg total) by mouth every 6 (six) hours as needed for muscle spasms.   traMADol 50 MG tablet Commonly known as:  ULTRAM Take 50 mg by mouth every 12 (twelve) hours as needed for moderate pain.   Vitamin D 2000 units tablet Take 2,000 Units by mouth daily.            Durable Medical  Equipment        Start     Ordered   06/25/16 1528  DME [place] rolling  Once    Question:  Patient needs a [place] to treat with the following condition  Answer:  Primary localized osteoarthritis of left knee   06/25/16 1527   06/25/16 1528  DME 3 n 1  Once     06/25/16 1527   06/25/16 1528  DME Bedside commode  Once    Question:  Patient needs a bedside commode to treat with the following condition  Answer:  Primary localized osteoarthritis of left knee   06/25/16 1527      Diagnostic Studies: Dg Chest 2 View  Result Date: 06/14/2016 CLINICAL DATA:  Preoperative evaluation for knee replacement surgery EXAM: CHEST  2 VIEW COMPARISON:  None. FINDINGS: Lungs are clear. Heart size and pulmonary vascularity are normal. No adenopathy. There is degenerative change in the thoracic spine. IMPRESSION: No edema or consolidation. Electronically Signed   By: Lowella Grip III M.D.   On: 06/14/2016 11:57    Disposition: Final discharge disposition not confirmed  Discharge Instructions    Call MD / Call 911    Complete by:  As directed    If you experience chest pain or shortness of breath, CALL 911 and be transported to the hospital emergency room.  If you develope a fever above 101 F, pus (white drainage) or increased drainage or redness at the wound, or calf pain, call your surgeon's office.   Constipation Prevention    Complete by:  As directed    Drink plenty of fluids.  Prune juice may be helpful.  You may use a stool softener, such as Colace (over the counter) 100 mg twice a day.  Use MiraLax (over the counter) for constipation as needed.   Diet - low sodium heart healthy    Complete by:  As directed    Driving restrictions    Complete by:  As directed    No driving for 2 weeks   Increase activity slowly as tolerated    Complete by:  As directed    Patient may shower    Complete by:  As directed    You may shower without a dressing once there is no drainage.  Do not wash over the  wound.  If drainage remains, cover wound with plastic wrap and then shower.      Follow-up Information    Kerin Salen, MD Follow up in 2 week(s).   Specialty:  Orthopedic Surgery Contact information: [phone]  [place] [address] [phone]        [facility] Follow up.   Specialty:  [place] Why:  Someone from [facility] will contact you to arrange start date and time for therapy. Contact information: [address] [place] [address] [phone]            Signed: Hardin Negus, Durrel Mcnee R 06/27/2016, 7:45 AM

## 2016-06-27 NOTE — Consult Note (Signed)
            [facility] CM Primary Care Navigator  06/27/2016  KAYLENE SHADDOCK [birth year] [id number]    Went to see patient at the bedside to identify possible discharge needsbut staff states she was already discharged to home (with home health).  Call placed to primary care provider's office and was able to find out that patient had been transferred to the service of Dr. Emi [place] [facility] at [place]).  New primary care provider's office called (Katie) to notify of patient's discharge and need for possible post hospital follow-up and transition of care.   For additional questions please contact:  Edwena Felty A. Jamaury Gumz, BSN, RN-BC [facility] PRIMARY CARE Navigator Cell: [phone]

## 2016-06-27 NOTE — Progress Notes (Signed)
PATIENT ID: BIRD CRYMES  MRN: [id number]  DOB/AGE:  [birth year] / 66 y.o.  2 Days Post-Op Procedure(s) (LRB): TOTAL KNEE ARTHROPLASTY (Left)    PROGRESS NOTE Subjective: Patient is alert, oriented, no Nausea, no Vomiting, yes passing gas. Taking PO well. Denies SOB, Chest or Calf Pain. Using Incentive Spirometer, PAS in place. Ambulate WBAT with pt walking 100 ft , Patient reports pain as 4/10 .    Objective: Vital signs in last 24 hours: Vitals:   06/26/16 0515 06/26/16 1347 06/26/16 2143 06/27/16 0401  BP: (!) 109/46 121/64 (!) 119/54 (!) 122/54  Pulse: 71 73 71 71  Resp: 16 16 16 16   Temp: 97.5 F (36.4 C) 97.6 F (36.4 C) 98.1 F (36.7 C) 98.1 F (36.7 C)  TempSrc: Oral Oral Oral Oral  SpO2: 100% 99% 95% 100%  Weight:      Height:          Intake/Output from previous day: I/O last 3 completed shifts: In: 840 [P.O.:840] Out: 300 [Urine:300]   Intake/Output this shift: No intake/output data recorded.   LABORATORY DATA:  Recent Labs  06/26/16 0335 06/27/16 0458  WBC 10.9* 8.1  HGB 11.3* 10.4*  HCT 34.5* 31.8*  PLT 315 302  NA 135  --   K 4.2  --   CL 104  --   CO2 26  --   BUN 15  --   CREATININE 0.89  --   GLUCOSE 129*  --   CALCIUM 9.3  --     Examination: Neurologically intact Neurovascular intact Sensation intact distally Intact pulses distally Dorsiflexion/Plantar flexion intact Incision: dressing C/D/I No cellulitis present Compartment soft}  Assessment:   2 Days Post-Op Procedure(s) (LRB): TOTAL KNEE ARTHROPLASTY (Left) ADDITIONAL DIAGNOSIS: Expected Acute Blood Loss Anemia, hypothyroidism  Plan: PT/OT WBAT, AROM and PROM  DVT Prophylaxis:  SCDx72hrs, ASA 325 mg BID x 2 weeks DISCHARGE PLAN: Home DISCHARGE NEEDS: HHPT, Needs and 3-in-1 comode seat     Angello Chien R 06/27/2016, 7:42 AM

## 2016-06-28 DIAGNOSIS — Z79891 Long term (current) use of opiate analgesic: Secondary | ICD-10-CM | POA: Diagnosis not present

## 2016-06-28 DIAGNOSIS — E785 Hyperlipidemia, unspecified: Secondary | ICD-10-CM | POA: Diagnosis not present

## 2016-06-28 DIAGNOSIS — Z471 Aftercare following joint replacement surgery: Secondary | ICD-10-CM | POA: Diagnosis not present

## 2016-06-28 DIAGNOSIS — Z96652 Presence of left artificial knee joint: Secondary | ICD-10-CM | POA: Diagnosis not present

## 2016-06-28 DIAGNOSIS — Z7982 Long term (current) use of aspirin: Secondary | ICD-10-CM | POA: Diagnosis not present

## 2016-06-28 DIAGNOSIS — I10 Essential (primary) hypertension: Secondary | ICD-10-CM | POA: Diagnosis not present

## 2016-06-28 DIAGNOSIS — E039 Hypothyroidism, unspecified: Secondary | ICD-10-CM | POA: Diagnosis not present

## 2016-07-02 DIAGNOSIS — Z79891 Long term (current) use of opiate analgesic: Secondary | ICD-10-CM | POA: Diagnosis not present

## 2016-07-02 DIAGNOSIS — I10 Essential (primary) hypertension: Secondary | ICD-10-CM | POA: Diagnosis not present

## 2016-07-02 DIAGNOSIS — E785 Hyperlipidemia, unspecified: Secondary | ICD-10-CM | POA: Diagnosis not present

## 2016-07-02 DIAGNOSIS — E039 Hypothyroidism, unspecified: Secondary | ICD-10-CM | POA: Diagnosis not present

## 2016-07-02 DIAGNOSIS — Z7982 Long term (current) use of aspirin: Secondary | ICD-10-CM | POA: Diagnosis not present

## 2016-07-02 DIAGNOSIS — Z96652 Presence of left artificial knee joint: Secondary | ICD-10-CM | POA: Diagnosis not present

## 2016-07-02 DIAGNOSIS — Z471 Aftercare following joint replacement surgery: Secondary | ICD-10-CM | POA: Diagnosis not present

## 2016-07-09 DIAGNOSIS — Z7982 Long term (current) use of aspirin: Secondary | ICD-10-CM | POA: Diagnosis not present

## 2016-07-09 DIAGNOSIS — Z96652 Presence of left artificial knee joint: Secondary | ICD-10-CM | POA: Diagnosis not present

## 2016-07-09 DIAGNOSIS — I10 Essential (primary) hypertension: Secondary | ICD-10-CM | POA: Diagnosis not present

## 2016-07-09 DIAGNOSIS — E039 Hypothyroidism, unspecified: Secondary | ICD-10-CM | POA: Diagnosis not present

## 2016-07-09 DIAGNOSIS — Z79891 Long term (current) use of opiate analgesic: Secondary | ICD-10-CM | POA: Diagnosis not present

## 2016-07-09 DIAGNOSIS — E785 Hyperlipidemia, unspecified: Secondary | ICD-10-CM | POA: Diagnosis not present

## 2016-07-09 DIAGNOSIS — Z471 Aftercare following joint replacement surgery: Secondary | ICD-10-CM | POA: Diagnosis not present

## 2016-07-10 DIAGNOSIS — M1712 Unilateral primary osteoarthritis, left knee: Secondary | ICD-10-CM | POA: Diagnosis not present

## 2016-07-17 DIAGNOSIS — M25562 Pain in left knee: Secondary | ICD-10-CM | POA: Diagnosis not present

## 2016-07-17 DIAGNOSIS — Z96652 Presence of left artificial knee joint: Secondary | ICD-10-CM | POA: Diagnosis not present

## 2016-07-17 DIAGNOSIS — M25662 Stiffness of left knee, not elsewhere classified: Secondary | ICD-10-CM | POA: Diagnosis not present

## 2016-07-19 DIAGNOSIS — M25662 Stiffness of left knee, not elsewhere classified: Secondary | ICD-10-CM | POA: Diagnosis not present

## 2016-07-19 DIAGNOSIS — M25562 Pain in left knee: Secondary | ICD-10-CM | POA: Diagnosis not present

## 2016-07-19 DIAGNOSIS — Z96652 Presence of left artificial knee joint: Secondary | ICD-10-CM | POA: Diagnosis not present

## 2016-07-24 DIAGNOSIS — Z96652 Presence of left artificial knee joint: Secondary | ICD-10-CM | POA: Diagnosis not present

## 2016-07-24 DIAGNOSIS — M25662 Stiffness of left knee, not elsewhere classified: Secondary | ICD-10-CM | POA: Diagnosis not present

## 2016-07-24 DIAGNOSIS — M25562 Pain in left knee: Secondary | ICD-10-CM | POA: Diagnosis not present

## 2016-07-26 DIAGNOSIS — M25562 Pain in left knee: Secondary | ICD-10-CM | POA: Diagnosis not present

## 2016-07-26 DIAGNOSIS — M25662 Stiffness of left knee, not elsewhere classified: Secondary | ICD-10-CM | POA: Diagnosis not present

## 2016-07-26 DIAGNOSIS — Z96652 Presence of left artificial knee joint: Secondary | ICD-10-CM | POA: Diagnosis not present

## 2016-07-31 DIAGNOSIS — M25562 Pain in left knee: Secondary | ICD-10-CM | POA: Diagnosis not present

## 2016-07-31 DIAGNOSIS — M25662 Stiffness of left knee, not elsewhere classified: Secondary | ICD-10-CM | POA: Diagnosis not present

## 2016-07-31 DIAGNOSIS — Z96652 Presence of left artificial knee joint: Secondary | ICD-10-CM | POA: Diagnosis not present

## 2016-08-07 DIAGNOSIS — Z96652 Presence of left artificial knee joint: Secondary | ICD-10-CM | POA: Diagnosis not present

## 2016-08-07 DIAGNOSIS — M25562 Pain in left knee: Secondary | ICD-10-CM | POA: Diagnosis not present

## 2016-08-07 DIAGNOSIS — M25662 Stiffness of left knee, not elsewhere classified: Secondary | ICD-10-CM | POA: Diagnosis not present

## 2016-08-15 DIAGNOSIS — M25662 Stiffness of left knee, not elsewhere classified: Secondary | ICD-10-CM | POA: Diagnosis not present

## 2016-08-15 DIAGNOSIS — M25562 Pain in left knee: Secondary | ICD-10-CM | POA: Diagnosis not present

## 2016-08-15 DIAGNOSIS — Z96652 Presence of left artificial knee joint: Secondary | ICD-10-CM | POA: Diagnosis not present

## 2016-08-21 DIAGNOSIS — M25662 Stiffness of left knee, not elsewhere classified: Secondary | ICD-10-CM | POA: Diagnosis not present

## 2016-08-21 DIAGNOSIS — M25562 Pain in left knee: Secondary | ICD-10-CM | POA: Diagnosis not present

## 2016-08-21 DIAGNOSIS — Z96652 Presence of left artificial knee joint: Secondary | ICD-10-CM | POA: Diagnosis not present

## 2016-08-21 DIAGNOSIS — Z9889 Other specified postprocedural states: Secondary | ICD-10-CM | POA: Diagnosis not present

## 2016-08-28 DIAGNOSIS — M25562 Pain in left knee: Secondary | ICD-10-CM | POA: Diagnosis not present

## 2016-08-28 DIAGNOSIS — M25662 Stiffness of left knee, not elsewhere classified: Secondary | ICD-10-CM | POA: Diagnosis not present

## 2016-08-28 DIAGNOSIS — Z96652 Presence of left artificial knee joint: Secondary | ICD-10-CM | POA: Diagnosis not present

## 2016-09-03 ENCOUNTER — Other Ambulatory Visit: Payer: Self-pay | Admitting: Internal Medicine

## 2016-09-03 DIAGNOSIS — Z23 Encounter for immunization: Secondary | ICD-10-CM | POA: Diagnosis not present

## 2016-09-03 DIAGNOSIS — E041 Nontoxic single thyroid nodule: Secondary | ICD-10-CM | POA: Diagnosis not present

## 2016-09-03 DIAGNOSIS — Z1389 Encounter for screening for other disorder: Secondary | ICD-10-CM | POA: Diagnosis not present

## 2016-09-03 DIAGNOSIS — Z0001 Encounter for general adult medical examination with abnormal findings: Secondary | ICD-10-CM | POA: Diagnosis not present

## 2016-09-03 DIAGNOSIS — M1712 Unilateral primary osteoarthritis, left knee: Secondary | ICD-10-CM | POA: Diagnosis not present

## 2016-09-03 DIAGNOSIS — E049 Nontoxic goiter, unspecified: Secondary | ICD-10-CM

## 2016-09-03 DIAGNOSIS — Z803 Family history of malignant neoplasm of breast: Secondary | ICD-10-CM | POA: Diagnosis not present

## 2016-09-11 DIAGNOSIS — Z96652 Presence of left artificial knee joint: Secondary | ICD-10-CM | POA: Diagnosis not present

## 2016-09-11 DIAGNOSIS — M25662 Stiffness of left knee, not elsewhere classified: Secondary | ICD-10-CM | POA: Diagnosis not present

## 2016-09-11 DIAGNOSIS — M25562 Pain in left knee: Secondary | ICD-10-CM | POA: Diagnosis not present

## 2016-09-17 ENCOUNTER — Ambulatory Visit
Admission: RE | Admit: 2016-09-17 | Discharge: 2016-09-17 | Disposition: A | Discharge status: Home / Self Care | Payer: PPO | Source: Ambulatory Visit | Attending: Internal Medicine | Admitting: Internal Medicine

## 2016-09-17 DIAGNOSIS — E049 Nontoxic goiter, unspecified: Secondary | ICD-10-CM

## 2016-09-17 DIAGNOSIS — E041 Nontoxic single thyroid nodule: Secondary | ICD-10-CM | POA: Diagnosis not present

## 2016-09-19 DIAGNOSIS — E042 Nontoxic multinodular goiter: Secondary | ICD-10-CM | POA: Diagnosis not present

## 2016-09-19 DIAGNOSIS — L669 Cicatricial alopecia, unspecified: Secondary | ICD-10-CM | POA: Diagnosis not present

## 2016-10-16 DIAGNOSIS — Z1212 Encounter for screening for malignant neoplasm of rectum: Secondary | ICD-10-CM | POA: Diagnosis not present

## 2016-10-16 DIAGNOSIS — Z1211 Encounter for screening for malignant neoplasm of colon: Secondary | ICD-10-CM | POA: Diagnosis not present

## 2016-11-06 DIAGNOSIS — M8588 Other specified disorders of bone density and structure, other site: Secondary | ICD-10-CM | POA: Diagnosis not present

## 2016-11-06 DIAGNOSIS — Z1231 Encounter for screening mammogram for malignant neoplasm of breast: Secondary | ICD-10-CM | POA: Diagnosis not present

## 2016-11-28 DIAGNOSIS — H401111 Primary open-angle glaucoma, right eye, mild stage: Secondary | ICD-10-CM | POA: Diagnosis not present

## 2016-11-28 DIAGNOSIS — H2513 Age-related nuclear cataract, bilateral: Secondary | ICD-10-CM | POA: Diagnosis not present

## 2016-11-28 DIAGNOSIS — H25013 Cortical age-related cataract, bilateral: Secondary | ICD-10-CM | POA: Diagnosis not present

## 2016-11-28 DIAGNOSIS — H401122 Primary open-angle glaucoma, left eye, moderate stage: Secondary | ICD-10-CM | POA: Diagnosis not present

## 2017-05-20 DIAGNOSIS — E041 Nontoxic single thyroid nodule: Secondary | ICD-10-CM | POA: Diagnosis not present

## 2017-05-20 DIAGNOSIS — I10 Essential (primary) hypertension: Secondary | ICD-10-CM | POA: Diagnosis not present

## 2017-05-20 DIAGNOSIS — E039 Hypothyroidism, unspecified: Secondary | ICD-10-CM | POA: Diagnosis not present

## 2017-06-26 DIAGNOSIS — H401122 Primary open-angle glaucoma, left eye, moderate stage: Secondary | ICD-10-CM | POA: Diagnosis not present

## 2017-06-26 DIAGNOSIS — H401111 Primary open-angle glaucoma, right eye, mild stage: Secondary | ICD-10-CM | POA: Diagnosis not present

## 2017-06-26 DIAGNOSIS — H04123 Dry eye syndrome of bilateral lacrimal glands: Secondary | ICD-10-CM | POA: Diagnosis not present

## 2017-06-26 DIAGNOSIS — H524 Presbyopia: Secondary | ICD-10-CM | POA: Diagnosis not present

## 2017-07-12 DIAGNOSIS — I10 Essential (primary) hypertension: Secondary | ICD-10-CM | POA: Diagnosis not present

## 2017-07-12 DIAGNOSIS — E039 Hypothyroidism, unspecified: Secondary | ICD-10-CM | POA: Diagnosis not present

## 2017-07-12 DIAGNOSIS — M1712 Unilateral primary osteoarthritis, left knee: Secondary | ICD-10-CM | POA: Diagnosis not present

## 2017-08-26 DIAGNOSIS — L669 Cicatricial alopecia, unspecified: Secondary | ICD-10-CM | POA: Diagnosis not present

## 2017-08-26 DIAGNOSIS — E7800 Pure hypercholesterolemia, unspecified: Secondary | ICD-10-CM | POA: Diagnosis not present

## 2017-08-26 DIAGNOSIS — E559 Vitamin D deficiency, unspecified: Secondary | ICD-10-CM | POA: Diagnosis not present

## 2017-08-26 DIAGNOSIS — I10 Essential (primary) hypertension: Secondary | ICD-10-CM | POA: Diagnosis not present

## 2017-09-05 DIAGNOSIS — M1712 Unilateral primary osteoarthritis, left knee: Secondary | ICD-10-CM | POA: Diagnosis not present

## 2017-09-05 DIAGNOSIS — I10 Essential (primary) hypertension: Secondary | ICD-10-CM | POA: Diagnosis not present

## 2017-09-05 DIAGNOSIS — E039 Hypothyroidism, unspecified: Secondary | ICD-10-CM | POA: Diagnosis not present

## 2017-12-17 DIAGNOSIS — Z6832 Body mass index (BMI) 32.0-32.9, adult: Secondary | ICD-10-CM | POA: Diagnosis not present

## 2017-12-17 DIAGNOSIS — N952 Postmenopausal atrophic vaginitis: Secondary | ICD-10-CM | POA: Diagnosis not present

## 2017-12-17 DIAGNOSIS — Z01419 Encounter for gynecological examination (general) (routine) without abnormal findings: Secondary | ICD-10-CM | POA: Diagnosis not present

## 2017-12-17 DIAGNOSIS — Z1211 Encounter for screening for malignant neoplasm of colon: Secondary | ICD-10-CM | POA: Diagnosis not present

## 2017-12-17 DIAGNOSIS — Z1231 Encounter for screening mammogram for malignant neoplasm of breast: Secondary | ICD-10-CM | POA: Diagnosis not present

## 2018-01-07 DIAGNOSIS — E041 Nontoxic single thyroid nodule: Secondary | ICD-10-CM | POA: Diagnosis not present

## 2018-01-07 DIAGNOSIS — Z0000 Encounter for general adult medical examination without abnormal findings: Secondary | ICD-10-CM | POA: Diagnosis not present

## 2018-01-07 DIAGNOSIS — E7800 Pure hypercholesterolemia, unspecified: Secondary | ICD-10-CM | POA: Diagnosis not present

## 2018-01-07 DIAGNOSIS — Z1389 Encounter for screening for other disorder: Secondary | ICD-10-CM | POA: Diagnosis not present

## 2018-01-07 DIAGNOSIS — I10 Essential (primary) hypertension: Secondary | ICD-10-CM | POA: Diagnosis not present

## 2018-01-07 DIAGNOSIS — E559 Vitamin D deficiency, unspecified: Secondary | ICD-10-CM | POA: Diagnosis not present

## 2018-01-15 DIAGNOSIS — H401122 Primary open-angle glaucoma, left eye, moderate stage: Secondary | ICD-10-CM | POA: Diagnosis not present

## 2018-01-15 DIAGNOSIS — H2513 Age-related nuclear cataract, bilateral: Secondary | ICD-10-CM | POA: Diagnosis not present

## 2018-01-15 DIAGNOSIS — H401111 Primary open-angle glaucoma, right eye, mild stage: Secondary | ICD-10-CM | POA: Diagnosis not present

## 2018-01-15 DIAGNOSIS — H25013 Cortical age-related cataract, bilateral: Secondary | ICD-10-CM | POA: Diagnosis not present

## 2018-02-13 DIAGNOSIS — E039 Hypothyroidism, unspecified: Secondary | ICD-10-CM | POA: Diagnosis not present

## 2018-02-13 DIAGNOSIS — I10 Essential (primary) hypertension: Secondary | ICD-10-CM | POA: Diagnosis not present

## 2018-02-13 DIAGNOSIS — M1712 Unilateral primary osteoarthritis, left knee: Secondary | ICD-10-CM | POA: Diagnosis not present

## 2018-05-03 DIAGNOSIS — E039 Hypothyroidism, unspecified: Secondary | ICD-10-CM | POA: Diagnosis not present

## 2018-05-03 DIAGNOSIS — I10 Essential (primary) hypertension: Secondary | ICD-10-CM | POA: Diagnosis not present

## 2018-05-03 DIAGNOSIS — M1712 Unilateral primary osteoarthritis, left knee: Secondary | ICD-10-CM | POA: Diagnosis not present

## 2018-05-13 DIAGNOSIS — E039 Hypothyroidism, unspecified: Secondary | ICD-10-CM | POA: Diagnosis not present

## 2018-06-27 DIAGNOSIS — H04123 Dry eye syndrome of bilateral lacrimal glands: Secondary | ICD-10-CM | POA: Diagnosis not present

## 2018-06-27 DIAGNOSIS — H401122 Primary open-angle glaucoma, left eye, moderate stage: Secondary | ICD-10-CM | POA: Diagnosis not present

## 2018-06-27 DIAGNOSIS — H401111 Primary open-angle glaucoma, right eye, mild stage: Secondary | ICD-10-CM | POA: Diagnosis not present

## 2018-09-19 DIAGNOSIS — E039 Hypothyroidism, unspecified: Secondary | ICD-10-CM | POA: Diagnosis not present

## 2018-09-19 DIAGNOSIS — M1712 Unilateral primary osteoarthritis, left knee: Secondary | ICD-10-CM | POA: Diagnosis not present

## 2018-09-19 DIAGNOSIS — E7800 Pure hypercholesterolemia, unspecified: Secondary | ICD-10-CM | POA: Diagnosis not present

## 2018-09-19 DIAGNOSIS — I10 Essential (primary) hypertension: Secondary | ICD-10-CM | POA: Diagnosis not present

## 2018-10-21 IMAGING — CR DG CHEST 2V
2 series · 2 of 2 positions shown · non-contrast
Comparison: None.

CLINICAL DATA: Preoperative evaluation for knee replacement surgery

EXAM:
CHEST  2 VIEW

[w chest pa]
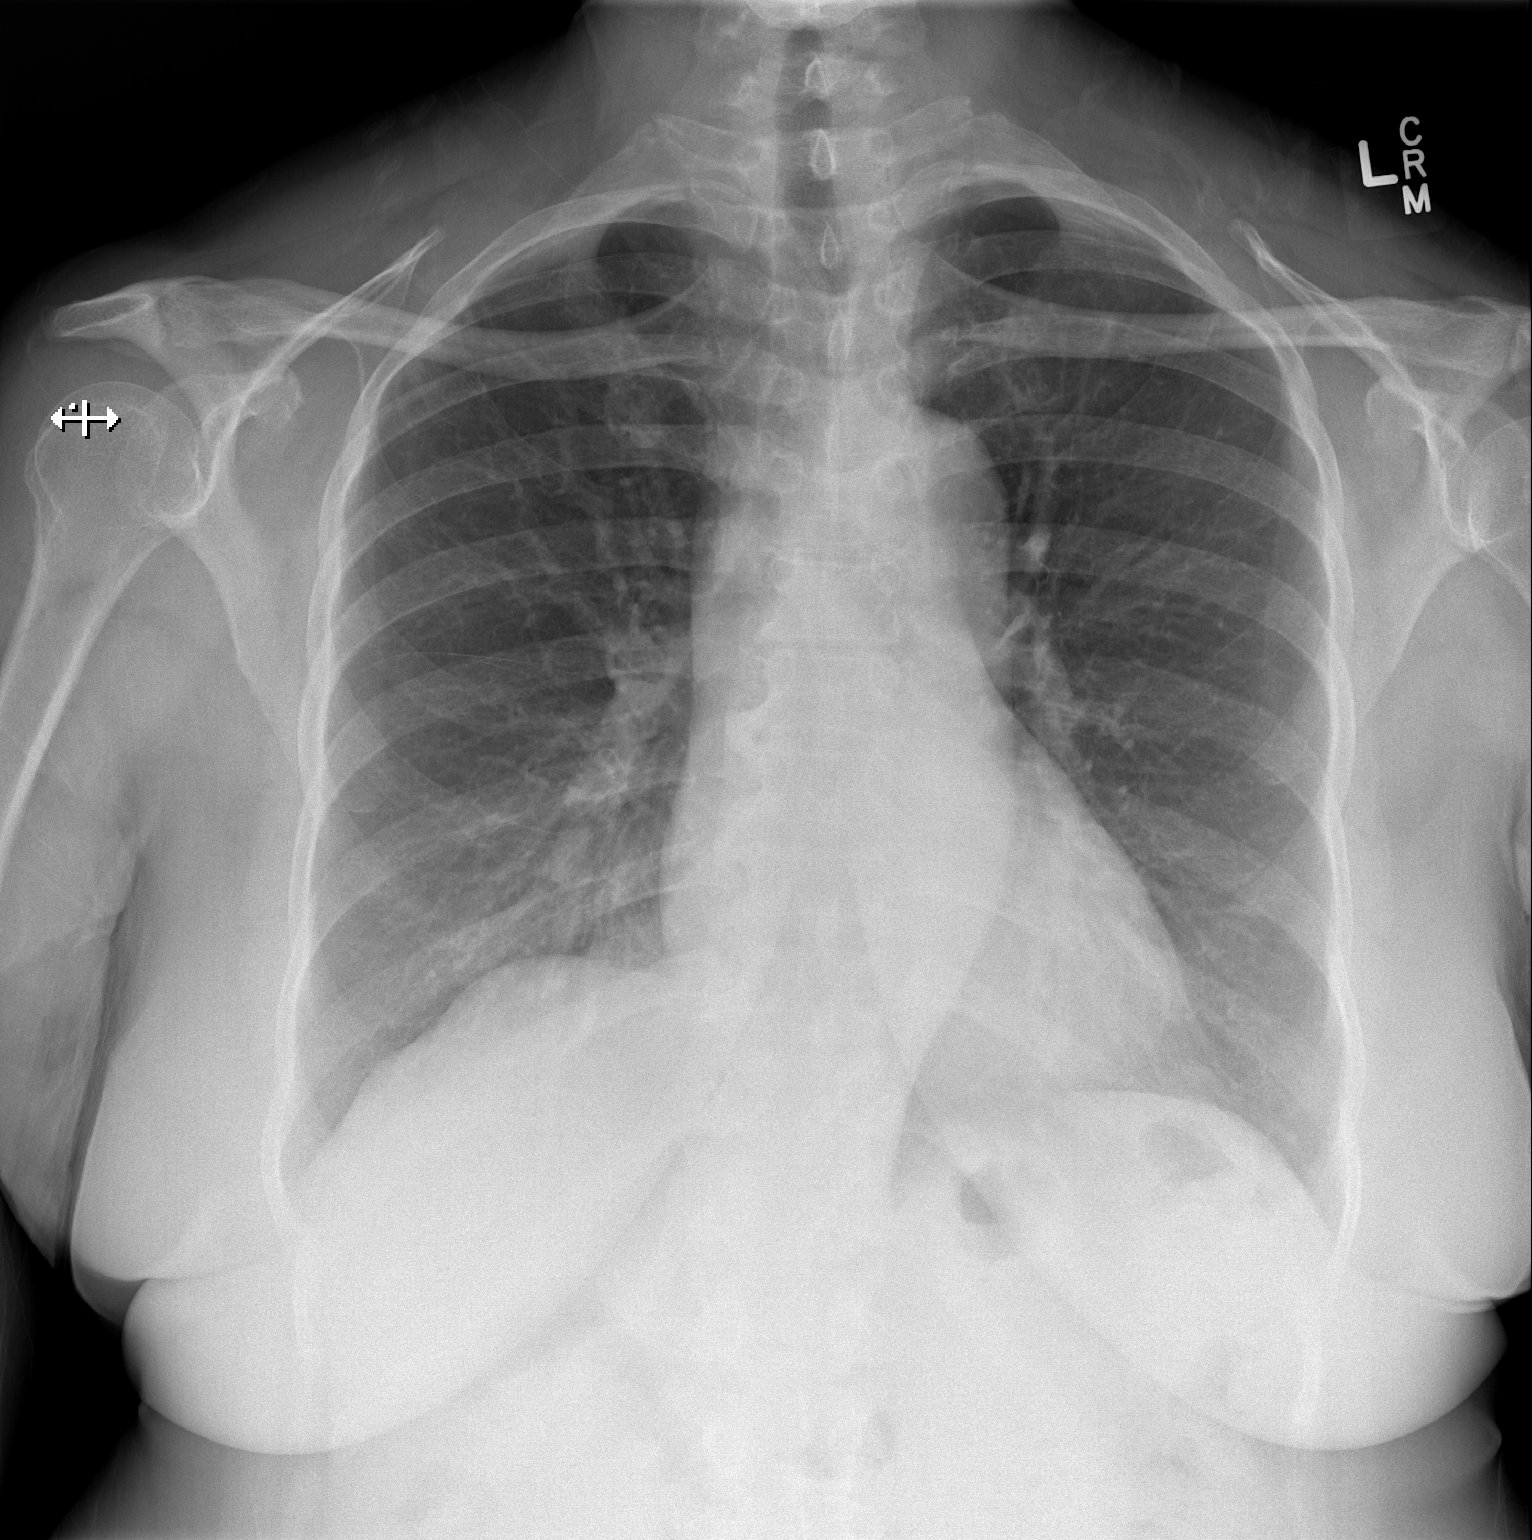

[w chest lat]
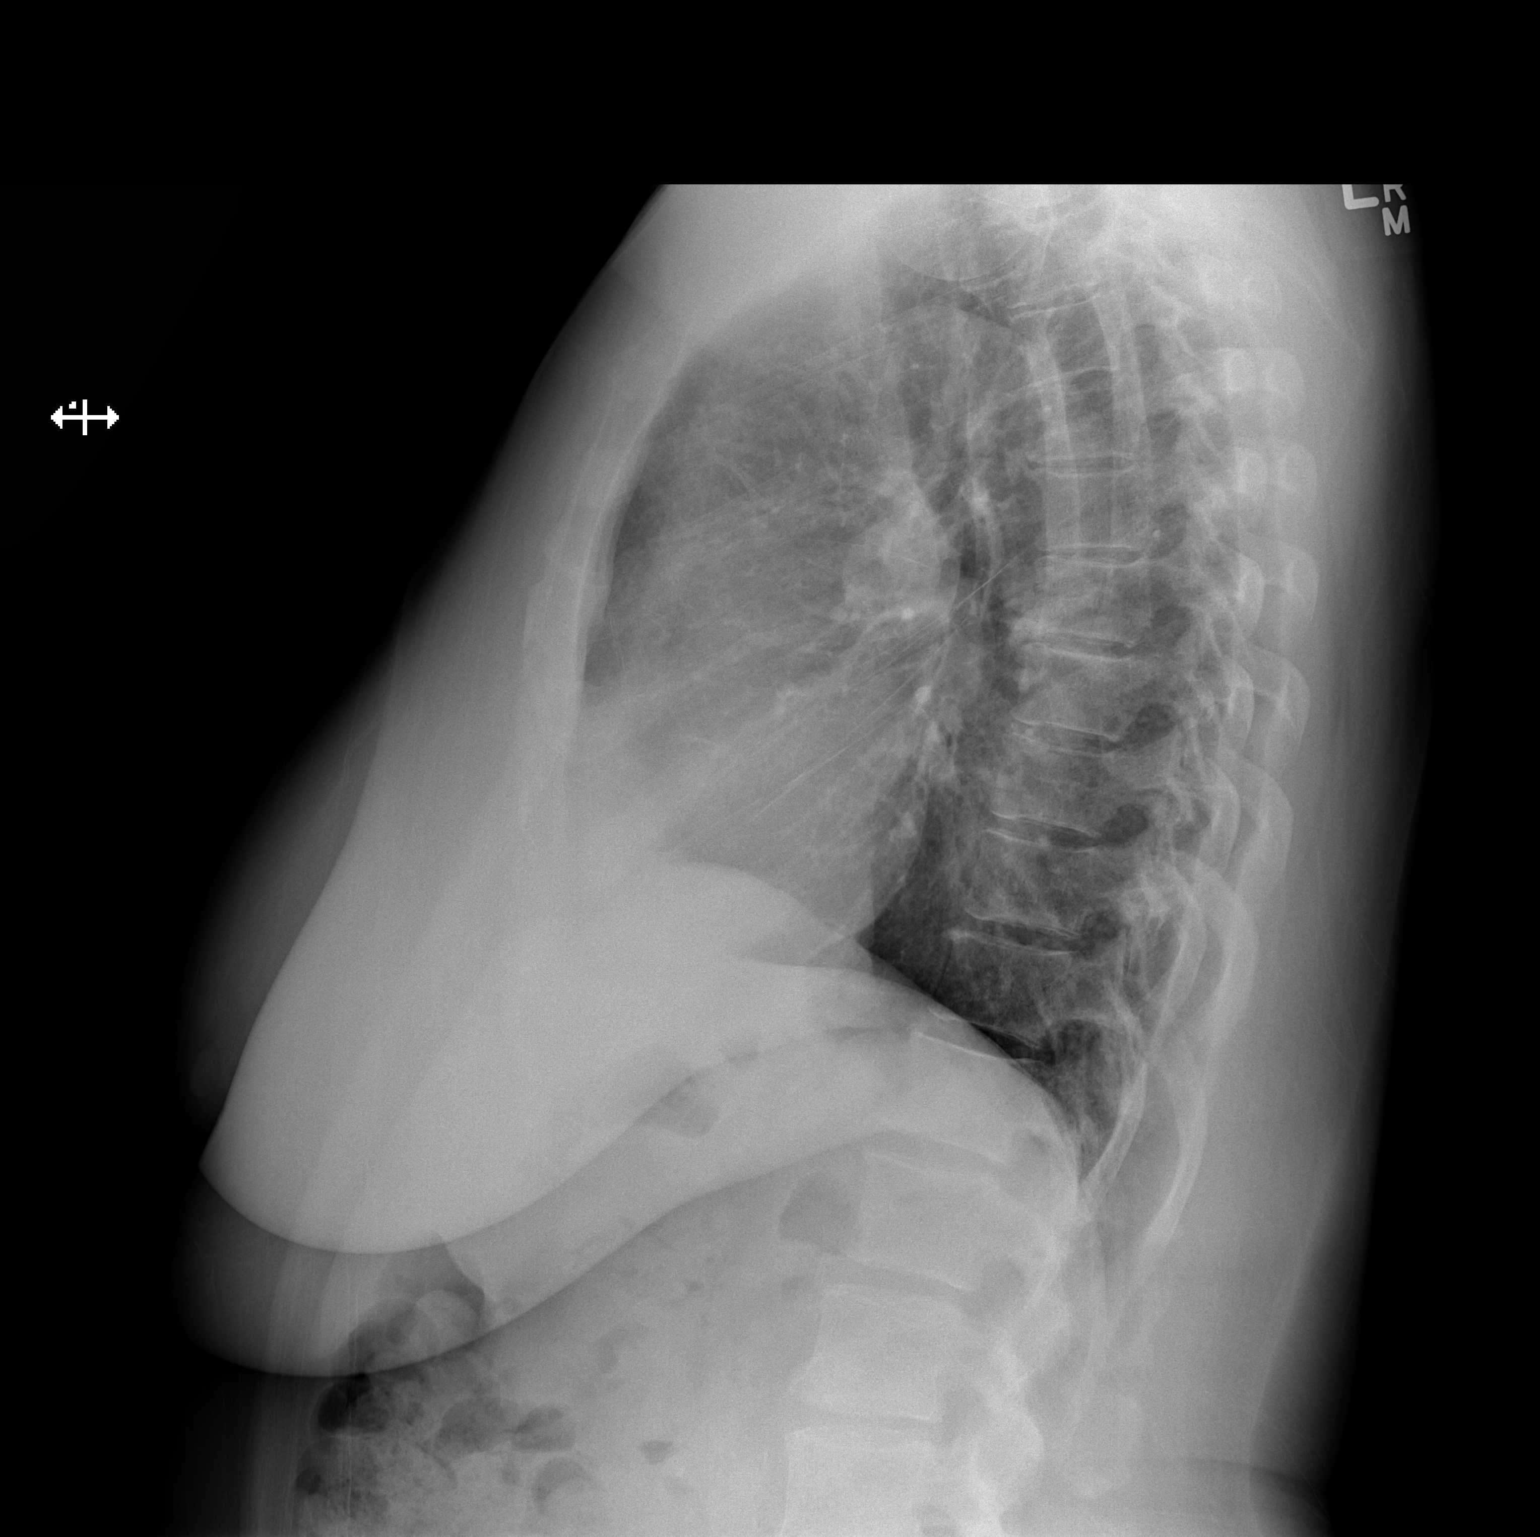

[2 of 2 positions shown; findings below may reference images not displayed]

FINDINGS: Lungs are clear. Heart size and pulmonary vascularity are normal. No
adenopathy. There is degenerative change in the thoracic spine.
IMPRESSION: No edema or consolidation.

## 2019-01-13 DIAGNOSIS — Z1231 Encounter for screening mammogram for malignant neoplasm of breast: Secondary | ICD-10-CM | POA: Diagnosis not present

## 2019-01-13 DIAGNOSIS — Z1389 Encounter for screening for other disorder: Secondary | ICD-10-CM | POA: Diagnosis not present

## 2019-01-13 DIAGNOSIS — L669 Cicatricial alopecia, unspecified: Secondary | ICD-10-CM | POA: Diagnosis not present

## 2019-01-13 DIAGNOSIS — Z13220 Encounter for screening for lipoid disorders: Secondary | ICD-10-CM | POA: Diagnosis not present

## 2019-01-13 DIAGNOSIS — I10 Essential (primary) hypertension: Secondary | ICD-10-CM | POA: Diagnosis not present

## 2019-01-13 DIAGNOSIS — Z1211 Encounter for screening for malignant neoplasm of colon: Secondary | ICD-10-CM | POA: Diagnosis not present

## 2019-01-13 DIAGNOSIS — E039 Hypothyroidism, unspecified: Secondary | ICD-10-CM | POA: Diagnosis not present

## 2019-01-13 DIAGNOSIS — M1712 Unilateral primary osteoarthritis, left knee: Secondary | ICD-10-CM | POA: Diagnosis not present

## 2019-01-13 DIAGNOSIS — Z0000 Encounter for general adult medical examination without abnormal findings: Secondary | ICD-10-CM | POA: Diagnosis not present

## 2019-01-13 DIAGNOSIS — E7800 Pure hypercholesterolemia, unspecified: Secondary | ICD-10-CM | POA: Diagnosis not present

## 2019-02-11 DIAGNOSIS — H2513 Age-related nuclear cataract, bilateral: Secondary | ICD-10-CM | POA: Diagnosis not present

## 2019-02-11 DIAGNOSIS — H401122 Primary open-angle glaucoma, left eye, moderate stage: Secondary | ICD-10-CM | POA: Diagnosis not present

## 2019-02-11 DIAGNOSIS — H401111 Primary open-angle glaucoma, right eye, mild stage: Secondary | ICD-10-CM | POA: Diagnosis not present

## 2019-02-11 DIAGNOSIS — H43813 Vitreous degeneration, bilateral: Secondary | ICD-10-CM | POA: Diagnosis not present

## 2019-02-17 DIAGNOSIS — Z6832 Body mass index (BMI) 32.0-32.9, adult: Secondary | ICD-10-CM | POA: Diagnosis not present

## 2019-02-17 DIAGNOSIS — Z01419 Encounter for gynecological examination (general) (routine) without abnormal findings: Secondary | ICD-10-CM | POA: Diagnosis not present

## 2019-02-17 DIAGNOSIS — Z1211 Encounter for screening for malignant neoplasm of colon: Secondary | ICD-10-CM | POA: Diagnosis not present

## 2019-02-17 DIAGNOSIS — Z1231 Encounter for screening mammogram for malignant neoplasm of breast: Secondary | ICD-10-CM | POA: Diagnosis not present

## 2019-02-17 DIAGNOSIS — N952 Postmenopausal atrophic vaginitis: Secondary | ICD-10-CM | POA: Diagnosis not present

## 2019-03-03 DIAGNOSIS — Z13820 Encounter for screening for osteoporosis: Secondary | ICD-10-CM | POA: Diagnosis not present

## 2019-03-12 DIAGNOSIS — H40051 Ocular hypertension, right eye: Secondary | ICD-10-CM | POA: Diagnosis not present

## 2019-03-12 DIAGNOSIS — H401111 Primary open-angle glaucoma, right eye, mild stage: Secondary | ICD-10-CM | POA: Diagnosis not present

## 2019-03-12 IMAGING — US US THYROID
1 series · 14 of 25 positions shown · non-contrast
Comparison: 02/16/2014

CLINICAL DATA: Prior ultrasound follow-up.

EXAM:
THYROID ULTRASOUND
TECHNIQUE: Ultrasound examination of the thyroid gland and adjacent soft
tissues was performed.

[Series 1: us thyroid · 0.06mm/px · 14 of 40 slices shown]
[im 1/40]
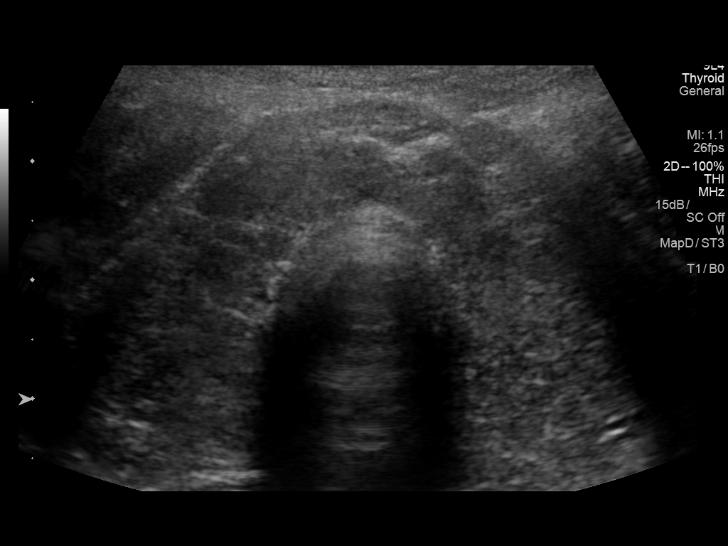
[im 4/40]
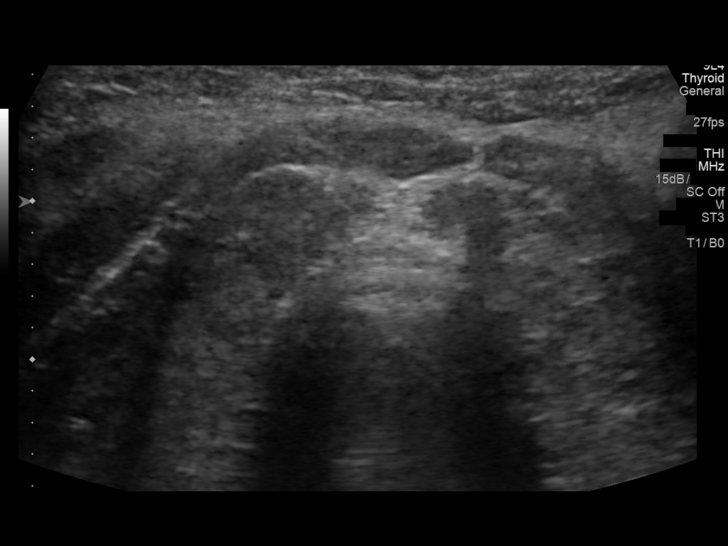
[im 7/40]
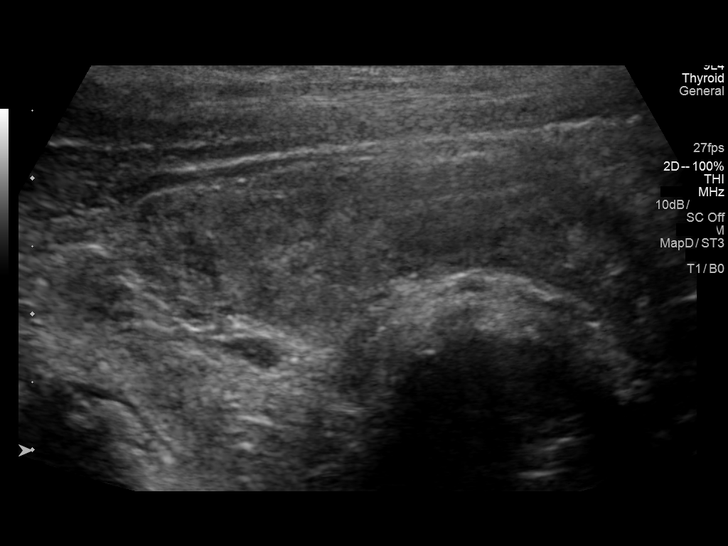
[im 10/40]
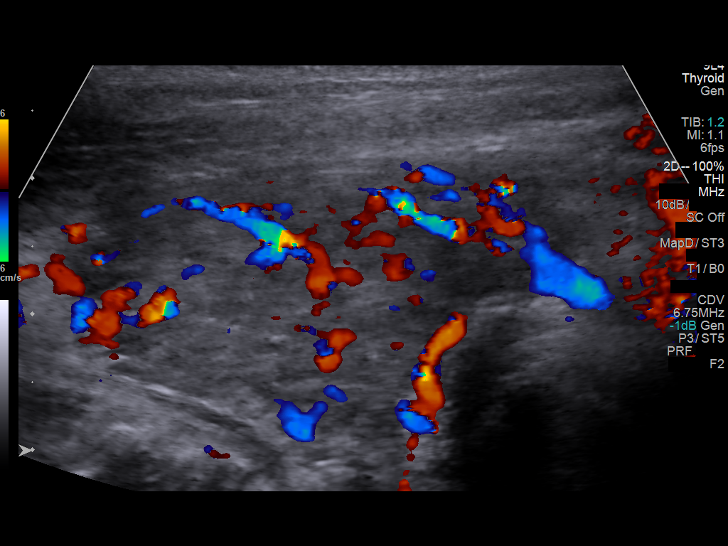
[im 14/40]
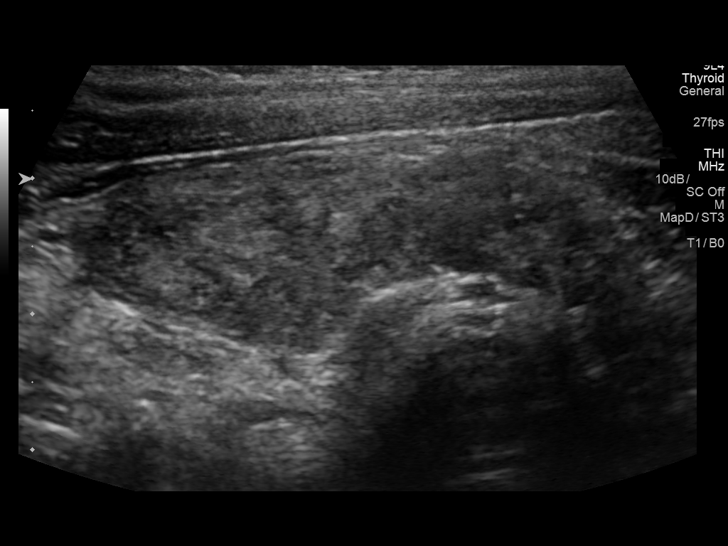
[im 15/40]
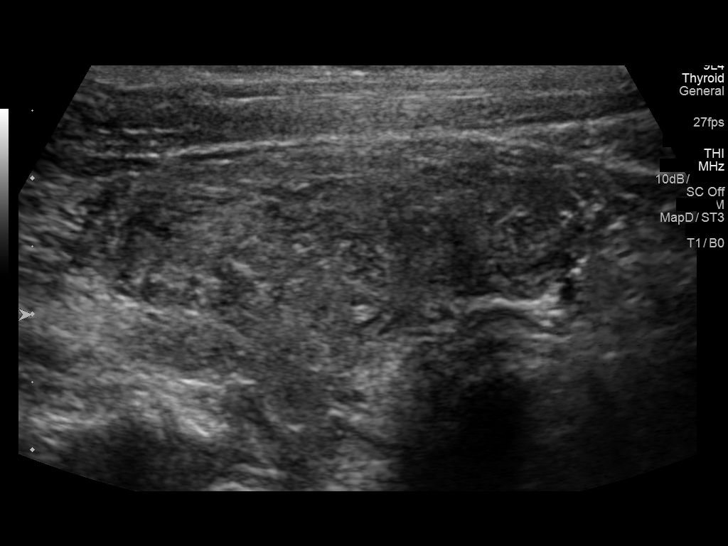
[im 18/40]
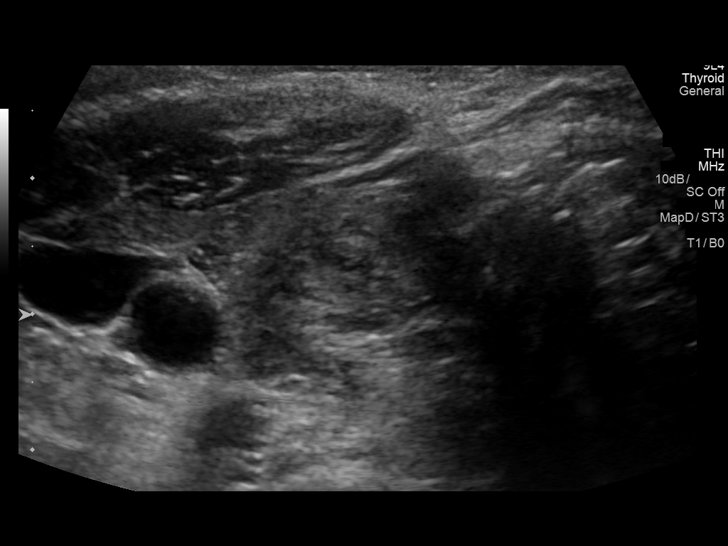
[im 22/40]
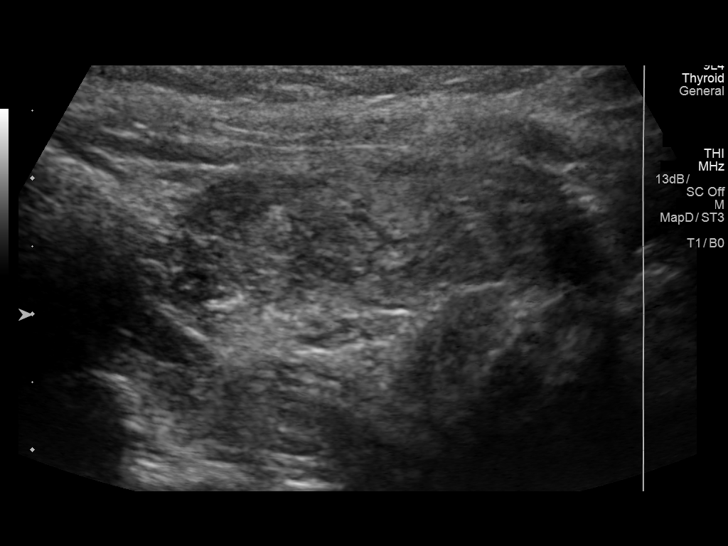
[im 25/40]
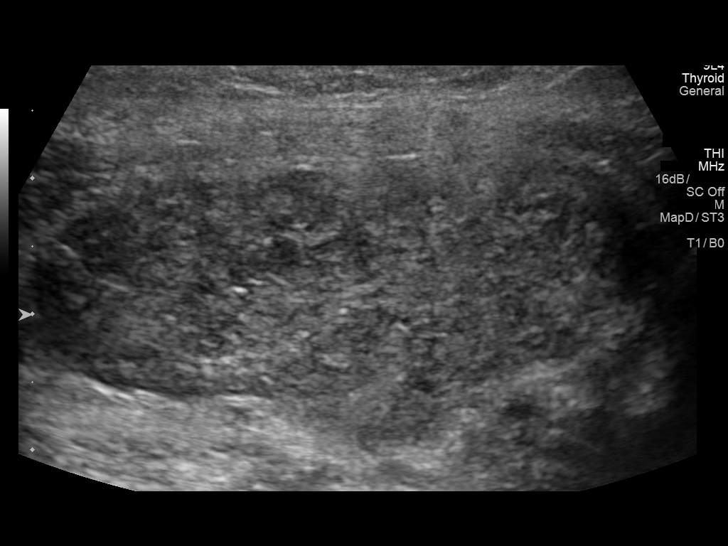
[im 27/40]
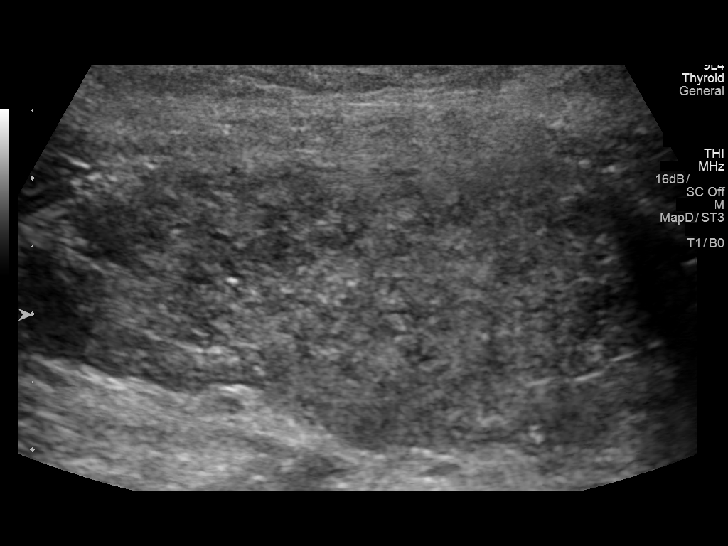
[im 30/40]
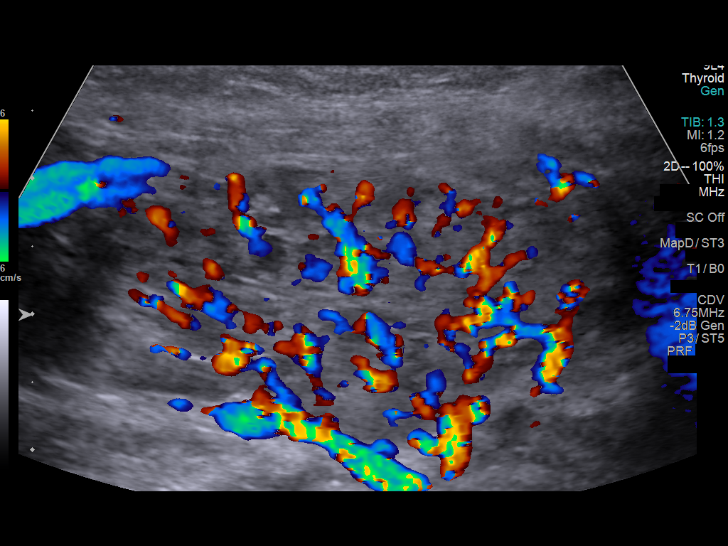
[im 33/40]
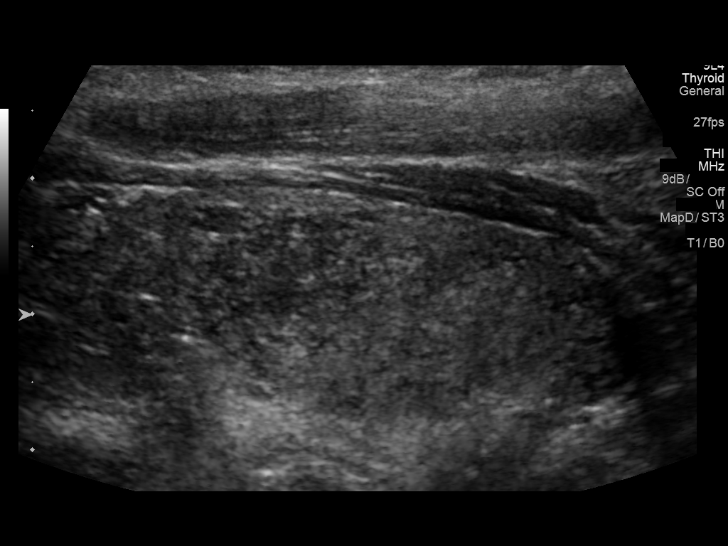
[im 36/40]
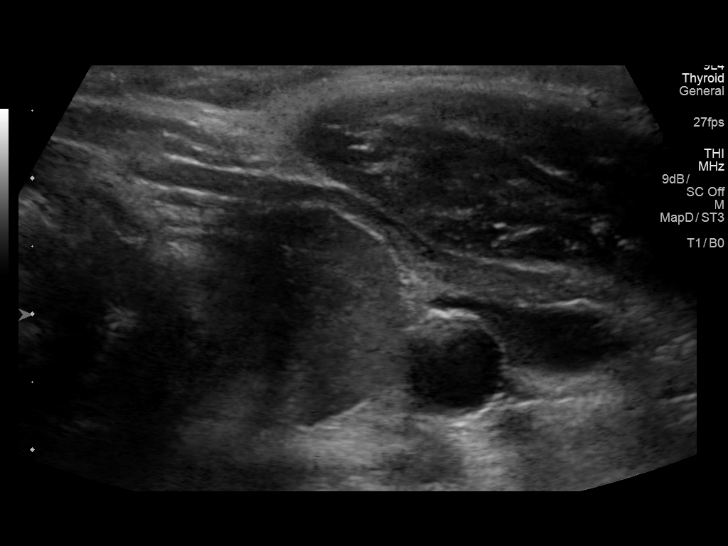
[im 40/40]
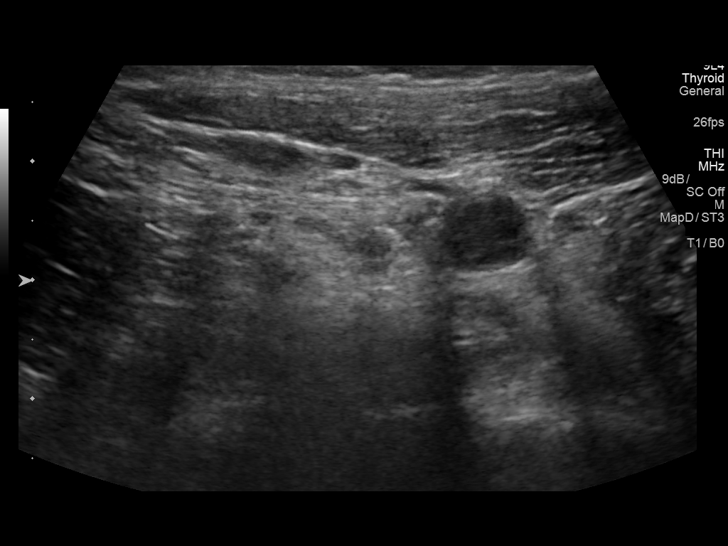

[14 of 25 positions shown; findings below may reference images not displayed]

FINDINGS: Parenchymal Echotexture: Markedly heterogenous

Isthmus: 0.5 cm, previously 0.6 cm

Right lobe: 4.2 x 2.6 x 1.9 cm, previously 4.3 x 2.5 x 2.1 cm

Left lobe: 4.9 x 2.3 x 1.9 cm, previously 5.0 x 2.3 x 2.1 cm

_________________________________________________________

Estimated total number of nodules >/= 1 cm: 0

Number of spongiform nodules >/=  2 cm not described below (TR1): 0

Number of mixed cystic and solid nodules >/= 1.5 cm not described
below (TR2): 0

_________________________________________________________

0.5 cm hyperechoic and calcified nodule in the left lower pole is
stable.
IMPRESSION: Stable benign-appearing 0.5 cm nodule on the left lower pole. This
study does not meet criteria for biopsy or annual follow-up.

The above is in keeping with the ACR TI-RADS recommendations - [HOSPITAL] 1437;[DATE].

## 2019-03-23 DIAGNOSIS — H40052 Ocular hypertension, left eye: Secondary | ICD-10-CM | POA: Diagnosis not present

## 2019-03-23 DIAGNOSIS — H401122 Primary open-angle glaucoma, left eye, moderate stage: Secondary | ICD-10-CM | POA: Diagnosis not present

## 2019-04-22 DIAGNOSIS — H401122 Primary open-angle glaucoma, left eye, moderate stage: Secondary | ICD-10-CM | POA: Diagnosis not present

## 2019-04-22 DIAGNOSIS — H40053 Ocular hypertension, bilateral: Secondary | ICD-10-CM | POA: Diagnosis not present

## 2019-04-22 DIAGNOSIS — H401111 Primary open-angle glaucoma, right eye, mild stage: Secondary | ICD-10-CM | POA: Diagnosis not present

## 2019-06-11 DIAGNOSIS — H40053 Ocular hypertension, bilateral: Secondary | ICD-10-CM | POA: Diagnosis not present

## 2019-06-11 DIAGNOSIS — H40033 Anatomical narrow angle, bilateral: Secondary | ICD-10-CM | POA: Diagnosis not present

## 2019-06-11 DIAGNOSIS — H401122 Primary open-angle glaucoma, left eye, moderate stage: Secondary | ICD-10-CM | POA: Diagnosis not present

## 2019-06-11 DIAGNOSIS — H401111 Primary open-angle glaucoma, right eye, mild stage: Secondary | ICD-10-CM | POA: Diagnosis not present

## 2019-07-17 DIAGNOSIS — M1712 Unilateral primary osteoarthritis, left knee: Secondary | ICD-10-CM | POA: Diagnosis not present

## 2019-07-17 DIAGNOSIS — E7800 Pure hypercholesterolemia, unspecified: Secondary | ICD-10-CM | POA: Diagnosis not present

## 2019-07-17 DIAGNOSIS — E039 Hypothyroidism, unspecified: Secondary | ICD-10-CM | POA: Diagnosis not present

## 2019-07-17 DIAGNOSIS — I10 Essential (primary) hypertension: Secondary | ICD-10-CM | POA: Diagnosis not present

## 2019-08-10 DIAGNOSIS — I10 Essential (primary) hypertension: Secondary | ICD-10-CM | POA: Diagnosis not present

## 2019-08-10 DIAGNOSIS — E7800 Pure hypercholesterolemia, unspecified: Secondary | ICD-10-CM | POA: Diagnosis not present

## 2019-08-10 DIAGNOSIS — M1712 Unilateral primary osteoarthritis, left knee: Secondary | ICD-10-CM | POA: Diagnosis not present

## 2019-08-10 DIAGNOSIS — E039 Hypothyroidism, unspecified: Secondary | ICD-10-CM | POA: Diagnosis not present

## 2019-08-19 DIAGNOSIS — H401122 Primary open-angle glaucoma, left eye, moderate stage: Secondary | ICD-10-CM | POA: Diagnosis not present

## 2019-08-19 DIAGNOSIS — H40053 Ocular hypertension, bilateral: Secondary | ICD-10-CM | POA: Diagnosis not present

## 2019-08-19 DIAGNOSIS — H401111 Primary open-angle glaucoma, right eye, mild stage: Secondary | ICD-10-CM | POA: Diagnosis not present

## 2019-10-01 DIAGNOSIS — Z800 Family history of malignant neoplasm of digestive organs: Secondary | ICD-10-CM | POA: Diagnosis not present

## 2019-10-01 DIAGNOSIS — Z86010 Personal history of colonic polyps: Secondary | ICD-10-CM | POA: Diagnosis not present

## 2019-10-01 DIAGNOSIS — E669 Obesity, unspecified: Secondary | ICD-10-CM | POA: Diagnosis not present

## 2019-10-01 DIAGNOSIS — K5730 Diverticulosis of large intestine without perforation or abscess without bleeding: Secondary | ICD-10-CM | POA: Diagnosis not present

## 2019-10-01 DIAGNOSIS — Z1211 Encounter for screening for malignant neoplasm of colon: Secondary | ICD-10-CM | POA: Diagnosis not present

## 2019-11-04 DIAGNOSIS — Z1211 Encounter for screening for malignant neoplasm of colon: Secondary | ICD-10-CM | POA: Diagnosis not present

## 2019-11-04 DIAGNOSIS — Z86010 Personal history of colonic polyps: Secondary | ICD-10-CM | POA: Diagnosis not present

## 2019-11-04 DIAGNOSIS — D122 Benign neoplasm of ascending colon: Secondary | ICD-10-CM | POA: Diagnosis not present

## 2019-11-04 DIAGNOSIS — Z800 Family history of malignant neoplasm of digestive organs: Secondary | ICD-10-CM | POA: Diagnosis not present

## 2019-11-04 DIAGNOSIS — K635 Polyp of colon: Secondary | ICD-10-CM | POA: Diagnosis not present

## 2019-11-23 DIAGNOSIS — H401122 Primary open-angle glaucoma, left eye, moderate stage: Secondary | ICD-10-CM | POA: Diagnosis not present

## 2019-11-23 DIAGNOSIS — H1045 Other chronic allergic conjunctivitis: Secondary | ICD-10-CM | POA: Diagnosis not present

## 2019-11-23 DIAGNOSIS — H401111 Primary open-angle glaucoma, right eye, mild stage: Secondary | ICD-10-CM | POA: Diagnosis not present

## 2019-11-23 DIAGNOSIS — H40053 Ocular hypertension, bilateral: Secondary | ICD-10-CM | POA: Diagnosis not present

## 2019-12-31 DIAGNOSIS — E039 Hypothyroidism, unspecified: Secondary | ICD-10-CM | POA: Diagnosis not present

## 2019-12-31 DIAGNOSIS — M1712 Unilateral primary osteoarthritis, left knee: Secondary | ICD-10-CM | POA: Diagnosis not present

## 2019-12-31 DIAGNOSIS — E7800 Pure hypercholesterolemia, unspecified: Secondary | ICD-10-CM | POA: Diagnosis not present

## 2019-12-31 DIAGNOSIS — I10 Essential (primary) hypertension: Secondary | ICD-10-CM | POA: Diagnosis not present

## 2020-01-07 DIAGNOSIS — M1712 Unilateral primary osteoarthritis, left knee: Secondary | ICD-10-CM | POA: Diagnosis not present

## 2020-01-07 DIAGNOSIS — I10 Essential (primary) hypertension: Secondary | ICD-10-CM | POA: Diagnosis not present

## 2020-01-07 DIAGNOSIS — E039 Hypothyroidism, unspecified: Secondary | ICD-10-CM | POA: Diagnosis not present

## 2020-01-07 DIAGNOSIS — E7800 Pure hypercholesterolemia, unspecified: Secondary | ICD-10-CM | POA: Diagnosis not present

## 2020-01-26 DIAGNOSIS — M1712 Unilateral primary osteoarthritis, left knee: Secondary | ICD-10-CM | POA: Diagnosis not present

## 2020-01-26 DIAGNOSIS — Z1389 Encounter for screening for other disorder: Secondary | ICD-10-CM | POA: Diagnosis not present

## 2020-01-26 DIAGNOSIS — E039 Hypothyroidism, unspecified: Secondary | ICD-10-CM | POA: Diagnosis not present

## 2020-01-26 DIAGNOSIS — Z800 Family history of malignant neoplasm of digestive organs: Secondary | ICD-10-CM | POA: Diagnosis not present

## 2020-01-26 DIAGNOSIS — E669 Obesity, unspecified: Secondary | ICD-10-CM | POA: Diagnosis not present

## 2020-01-26 DIAGNOSIS — I10 Essential (primary) hypertension: Secondary | ICD-10-CM | POA: Diagnosis not present

## 2020-01-26 DIAGNOSIS — Z1231 Encounter for screening mammogram for malignant neoplasm of breast: Secondary | ICD-10-CM | POA: Diagnosis not present

## 2020-01-26 DIAGNOSIS — Z1211 Encounter for screening for malignant neoplasm of colon: Secondary | ICD-10-CM | POA: Diagnosis not present

## 2020-01-26 DIAGNOSIS — Z0000 Encounter for general adult medical examination without abnormal findings: Secondary | ICD-10-CM | POA: Diagnosis not present

## 2020-01-26 DIAGNOSIS — E7800 Pure hypercholesterolemia, unspecified: Secondary | ICD-10-CM | POA: Diagnosis not present

## 2020-02-18 DIAGNOSIS — H40053 Ocular hypertension, bilateral: Secondary | ICD-10-CM | POA: Diagnosis not present

## 2020-02-18 DIAGNOSIS — H401122 Primary open-angle glaucoma, left eye, moderate stage: Secondary | ICD-10-CM | POA: Diagnosis not present

## 2020-02-18 DIAGNOSIS — H401111 Primary open-angle glaucoma, right eye, mild stage: Secondary | ICD-10-CM | POA: Diagnosis not present

## 2020-02-18 DIAGNOSIS — H40033 Anatomical narrow angle, bilateral: Secondary | ICD-10-CM | POA: Diagnosis not present

## 2020-02-23 DIAGNOSIS — Z6832 Body mass index (BMI) 32.0-32.9, adult: Secondary | ICD-10-CM | POA: Diagnosis not present

## 2020-02-23 DIAGNOSIS — Z800 Family history of malignant neoplasm of digestive organs: Secondary | ICD-10-CM | POA: Diagnosis not present

## 2020-02-23 DIAGNOSIS — Z1231 Encounter for screening mammogram for malignant neoplasm of breast: Secondary | ICD-10-CM | POA: Diagnosis not present

## 2020-02-23 DIAGNOSIS — N952 Postmenopausal atrophic vaginitis: Secondary | ICD-10-CM | POA: Diagnosis not present

## 2020-02-23 DIAGNOSIS — Z1211 Encounter for screening for malignant neoplasm of colon: Secondary | ICD-10-CM | POA: Diagnosis not present

## 2020-02-23 DIAGNOSIS — Z01419 Encounter for gynecological examination (general) (routine) without abnormal findings: Secondary | ICD-10-CM | POA: Diagnosis not present

## 2020-03-01 DIAGNOSIS — M1712 Unilateral primary osteoarthritis, left knee: Secondary | ICD-10-CM | POA: Diagnosis not present

## 2020-03-01 DIAGNOSIS — I10 Essential (primary) hypertension: Secondary | ICD-10-CM | POA: Diagnosis not present

## 2020-03-01 DIAGNOSIS — E7800 Pure hypercholesterolemia, unspecified: Secondary | ICD-10-CM | POA: Diagnosis not present

## 2020-03-01 DIAGNOSIS — E039 Hypothyroidism, unspecified: Secondary | ICD-10-CM | POA: Diagnosis not present

## 2020-03-09 DIAGNOSIS — Z1231 Encounter for screening mammogram for malignant neoplasm of breast: Secondary | ICD-10-CM | POA: Diagnosis not present

## 2020-05-03 DIAGNOSIS — M1712 Unilateral primary osteoarthritis, left knee: Secondary | ICD-10-CM | POA: Diagnosis not present

## 2020-05-03 DIAGNOSIS — E7800 Pure hypercholesterolemia, unspecified: Secondary | ICD-10-CM | POA: Diagnosis not present

## 2020-05-03 DIAGNOSIS — I10 Essential (primary) hypertension: Secondary | ICD-10-CM | POA: Diagnosis not present

## 2020-05-03 DIAGNOSIS — E039 Hypothyroidism, unspecified: Secondary | ICD-10-CM | POA: Diagnosis not present

## 2020-05-13 DIAGNOSIS — M1712 Unilateral primary osteoarthritis, left knee: Secondary | ICD-10-CM | POA: Diagnosis not present

## 2020-05-13 DIAGNOSIS — E7800 Pure hypercholesterolemia, unspecified: Secondary | ICD-10-CM | POA: Diagnosis not present

## 2020-05-13 DIAGNOSIS — E039 Hypothyroidism, unspecified: Secondary | ICD-10-CM | POA: Diagnosis not present

## 2020-05-13 DIAGNOSIS — I10 Essential (primary) hypertension: Secondary | ICD-10-CM | POA: Diagnosis not present

## 2020-06-28 DIAGNOSIS — M1712 Unilateral primary osteoarthritis, left knee: Secondary | ICD-10-CM | POA: Diagnosis not present

## 2020-06-28 DIAGNOSIS — E7800 Pure hypercholesterolemia, unspecified: Secondary | ICD-10-CM | POA: Diagnosis not present

## 2020-06-28 DIAGNOSIS — E039 Hypothyroidism, unspecified: Secondary | ICD-10-CM | POA: Diagnosis not present

## 2020-06-28 DIAGNOSIS — I10 Essential (primary) hypertension: Secondary | ICD-10-CM | POA: Diagnosis not present

## 2020-07-07 DIAGNOSIS — H40053 Ocular hypertension, bilateral: Secondary | ICD-10-CM | POA: Diagnosis not present

## 2020-07-07 DIAGNOSIS — H401122 Primary open-angle glaucoma, left eye, moderate stage: Secondary | ICD-10-CM | POA: Diagnosis not present

## 2020-07-07 DIAGNOSIS — H401111 Primary open-angle glaucoma, right eye, mild stage: Secondary | ICD-10-CM | POA: Diagnosis not present

## 2020-07-27 DIAGNOSIS — I10 Essential (primary) hypertension: Secondary | ICD-10-CM | POA: Diagnosis not present

## 2020-07-27 DIAGNOSIS — E7800 Pure hypercholesterolemia, unspecified: Secondary | ICD-10-CM | POA: Diagnosis not present

## 2020-08-11 DIAGNOSIS — E039 Hypothyroidism, unspecified: Secondary | ICD-10-CM | POA: Diagnosis not present

## 2020-08-11 DIAGNOSIS — I10 Essential (primary) hypertension: Secondary | ICD-10-CM | POA: Diagnosis not present

## 2020-08-11 DIAGNOSIS — M1712 Unilateral primary osteoarthritis, left knee: Secondary | ICD-10-CM | POA: Diagnosis not present

## 2020-08-11 DIAGNOSIS — E7800 Pure hypercholesterolemia, unspecified: Secondary | ICD-10-CM | POA: Diagnosis not present

## 2020-10-04 DIAGNOSIS — E7800 Pure hypercholesterolemia, unspecified: Secondary | ICD-10-CM | POA: Diagnosis not present

## 2020-10-04 DIAGNOSIS — I10 Essential (primary) hypertension: Secondary | ICD-10-CM | POA: Diagnosis not present

## 2020-10-04 DIAGNOSIS — M62838 Other muscle spasm: Secondary | ICD-10-CM | POA: Diagnosis not present

## 2020-10-06 DIAGNOSIS — M1712 Unilateral primary osteoarthritis, left knee: Secondary | ICD-10-CM | POA: Diagnosis not present

## 2020-10-06 DIAGNOSIS — E7800 Pure hypercholesterolemia, unspecified: Secondary | ICD-10-CM | POA: Diagnosis not present

## 2020-10-06 DIAGNOSIS — I10 Essential (primary) hypertension: Secondary | ICD-10-CM | POA: Diagnosis not present

## 2020-10-06 DIAGNOSIS — E039 Hypothyroidism, unspecified: Secondary | ICD-10-CM | POA: Diagnosis not present

## 2020-11-22 DIAGNOSIS — I10 Essential (primary) hypertension: Secondary | ICD-10-CM | POA: Diagnosis not present

## 2020-11-22 DIAGNOSIS — E039 Hypothyroidism, unspecified: Secondary | ICD-10-CM | POA: Diagnosis not present

## 2020-11-22 DIAGNOSIS — E7800 Pure hypercholesterolemia, unspecified: Secondary | ICD-10-CM | POA: Diagnosis not present

## 2020-11-22 DIAGNOSIS — M1712 Unilateral primary osteoarthritis, left knee: Secondary | ICD-10-CM | POA: Diagnosis not present

## 2021-01-27 DIAGNOSIS — Z1389 Encounter for screening for other disorder: Secondary | ICD-10-CM | POA: Diagnosis not present

## 2021-01-27 DIAGNOSIS — E039 Hypothyroidism, unspecified: Secondary | ICD-10-CM | POA: Diagnosis not present

## 2021-01-27 DIAGNOSIS — Z90710 Acquired absence of both cervix and uterus: Secondary | ICD-10-CM | POA: Diagnosis not present

## 2021-01-27 DIAGNOSIS — Z0000 Encounter for general adult medical examination without abnormal findings: Secondary | ICD-10-CM | POA: Diagnosis not present

## 2021-01-27 DIAGNOSIS — Z800 Family history of malignant neoplasm of digestive organs: Secondary | ICD-10-CM | POA: Diagnosis not present

## 2021-01-27 DIAGNOSIS — Z803 Family history of malignant neoplasm of breast: Secondary | ICD-10-CM | POA: Diagnosis not present

## 2021-01-27 DIAGNOSIS — H401122 Primary open-angle glaucoma, left eye, moderate stage: Secondary | ICD-10-CM | POA: Diagnosis not present

## 2021-01-27 DIAGNOSIS — L669 Cicatricial alopecia, unspecified: Secondary | ICD-10-CM | POA: Diagnosis not present

## 2021-01-27 DIAGNOSIS — I10 Essential (primary) hypertension: Secondary | ICD-10-CM | POA: Diagnosis not present

## 2021-01-27 DIAGNOSIS — E559 Vitamin D deficiency, unspecified: Secondary | ICD-10-CM | POA: Diagnosis not present

## 2021-01-27 DIAGNOSIS — E7800 Pure hypercholesterolemia, unspecified: Secondary | ICD-10-CM | POA: Diagnosis not present

## 2021-01-27 DIAGNOSIS — E049 Nontoxic goiter, unspecified: Secondary | ICD-10-CM | POA: Diagnosis not present

## 2021-01-27 DIAGNOSIS — E669 Obesity, unspecified: Secondary | ICD-10-CM | POA: Diagnosis not present

## 2021-02-02 DIAGNOSIS — H2513 Age-related nuclear cataract, bilateral: Secondary | ICD-10-CM | POA: Diagnosis not present

## 2021-02-02 DIAGNOSIS — H401122 Primary open-angle glaucoma, left eye, moderate stage: Secondary | ICD-10-CM | POA: Diagnosis not present

## 2021-02-02 DIAGNOSIS — H40053 Ocular hypertension, bilateral: Secondary | ICD-10-CM | POA: Diagnosis not present

## 2021-02-02 DIAGNOSIS — H401111 Primary open-angle glaucoma, right eye, mild stage: Secondary | ICD-10-CM | POA: Diagnosis not present

## 2021-02-02 DIAGNOSIS — H524 Presbyopia: Secondary | ICD-10-CM | POA: Diagnosis not present

## 2021-02-16 DIAGNOSIS — E7800 Pure hypercholesterolemia, unspecified: Secondary | ICD-10-CM | POA: Diagnosis not present

## 2021-02-16 DIAGNOSIS — E039 Hypothyroidism, unspecified: Secondary | ICD-10-CM | POA: Diagnosis not present

## 2021-02-16 DIAGNOSIS — I10 Essential (primary) hypertension: Secondary | ICD-10-CM | POA: Diagnosis not present

## 2021-02-16 DIAGNOSIS — M1712 Unilateral primary osteoarthritis, left knee: Secondary | ICD-10-CM | POA: Diagnosis not present

## 2021-03-14 DIAGNOSIS — N952 Postmenopausal atrophic vaginitis: Secondary | ICD-10-CM | POA: Diagnosis not present

## 2021-03-14 DIAGNOSIS — Z1231 Encounter for screening mammogram for malignant neoplasm of breast: Secondary | ICD-10-CM | POA: Diagnosis not present

## 2021-03-14 DIAGNOSIS — Z01419 Encounter for gynecological examination (general) (routine) without abnormal findings: Secondary | ICD-10-CM | POA: Diagnosis not present

## 2021-03-14 DIAGNOSIS — Z6832 Body mass index (BMI) 32.0-32.9, adult: Secondary | ICD-10-CM | POA: Diagnosis not present

## 2021-03-14 DIAGNOSIS — Z800 Family history of malignant neoplasm of digestive organs: Secondary | ICD-10-CM | POA: Diagnosis not present

## 2021-03-14 DIAGNOSIS — Z803 Family history of malignant neoplasm of breast: Secondary | ICD-10-CM | POA: Diagnosis not present

## 2021-08-02 DIAGNOSIS — H401122 Primary open-angle glaucoma, left eye, moderate stage: Secondary | ICD-10-CM | POA: Diagnosis not present

## 2021-08-02 DIAGNOSIS — E039 Hypothyroidism, unspecified: Secondary | ICD-10-CM | POA: Diagnosis not present

## 2021-08-02 DIAGNOSIS — E049 Nontoxic goiter, unspecified: Secondary | ICD-10-CM | POA: Diagnosis not present

## 2021-08-02 DIAGNOSIS — Z23 Encounter for immunization: Secondary | ICD-10-CM | POA: Diagnosis not present

## 2021-08-02 DIAGNOSIS — I10 Essential (primary) hypertension: Secondary | ICD-10-CM | POA: Diagnosis not present

## 2021-08-17 DIAGNOSIS — H25813 Combined forms of age-related cataract, bilateral: Secondary | ICD-10-CM | POA: Diagnosis not present

## 2021-08-17 DIAGNOSIS — H401122 Primary open-angle glaucoma, left eye, moderate stage: Secondary | ICD-10-CM | POA: Diagnosis not present

## 2021-08-17 DIAGNOSIS — H40033 Anatomical narrow angle, bilateral: Secondary | ICD-10-CM | POA: Diagnosis not present

## 2021-08-17 DIAGNOSIS — H401111 Primary open-angle glaucoma, right eye, mild stage: Secondary | ICD-10-CM | POA: Diagnosis not present

## 2022-02-13 DIAGNOSIS — E559 Vitamin D deficiency, unspecified: Secondary | ICD-10-CM | POA: Diagnosis not present

## 2022-02-13 DIAGNOSIS — M1712 Unilateral primary osteoarthritis, left knee: Secondary | ICD-10-CM | POA: Diagnosis not present

## 2022-02-13 DIAGNOSIS — E049 Nontoxic goiter, unspecified: Secondary | ICD-10-CM | POA: Diagnosis not present

## 2022-02-13 DIAGNOSIS — Z803 Family history of malignant neoplasm of breast: Secondary | ICD-10-CM | POA: Diagnosis not present

## 2022-02-13 DIAGNOSIS — E669 Obesity, unspecified: Secondary | ICD-10-CM | POA: Diagnosis not present

## 2022-02-13 DIAGNOSIS — Z1331 Encounter for screening for depression: Secondary | ICD-10-CM | POA: Diagnosis not present

## 2022-02-13 DIAGNOSIS — E039 Hypothyroidism, unspecified: Secondary | ICD-10-CM | POA: Diagnosis not present

## 2022-02-13 DIAGNOSIS — L669 Cicatricial alopecia, unspecified: Secondary | ICD-10-CM | POA: Diagnosis not present

## 2022-02-13 DIAGNOSIS — I10 Essential (primary) hypertension: Secondary | ICD-10-CM | POA: Diagnosis not present

## 2022-02-13 DIAGNOSIS — H401122 Primary open-angle glaucoma, left eye, moderate stage: Secondary | ICD-10-CM | POA: Diagnosis not present

## 2022-02-13 DIAGNOSIS — Z0000 Encounter for general adult medical examination without abnormal findings: Secondary | ICD-10-CM | POA: Diagnosis not present

## 2022-02-13 DIAGNOSIS — Z90710 Acquired absence of both cervix and uterus: Secondary | ICD-10-CM | POA: Diagnosis not present

## 2022-02-13 DIAGNOSIS — E7800 Pure hypercholesterolemia, unspecified: Secondary | ICD-10-CM | POA: Diagnosis not present

## 2022-02-13 DIAGNOSIS — Z1159 Encounter for screening for other viral diseases: Secondary | ICD-10-CM | POA: Diagnosis not present

## 2022-03-19 DIAGNOSIS — N952 Postmenopausal atrophic vaginitis: Secondary | ICD-10-CM | POA: Diagnosis not present

## 2022-03-19 DIAGNOSIS — Z6831 Body mass index (BMI) 31.0-31.9, adult: Secondary | ICD-10-CM | POA: Diagnosis not present

## 2022-03-19 DIAGNOSIS — Z800 Family history of malignant neoplasm of digestive organs: Secondary | ICD-10-CM | POA: Diagnosis not present

## 2022-03-19 DIAGNOSIS — Z803 Family history of malignant neoplasm of breast: Secondary | ICD-10-CM | POA: Diagnosis not present

## 2022-03-19 DIAGNOSIS — I10 Essential (primary) hypertension: Secondary | ICD-10-CM | POA: Diagnosis not present

## 2022-03-19 DIAGNOSIS — Z1231 Encounter for screening mammogram for malignant neoplasm of breast: Secondary | ICD-10-CM | POA: Diagnosis not present

## 2022-03-19 DIAGNOSIS — Z01419 Encounter for gynecological examination (general) (routine) without abnormal findings: Secondary | ICD-10-CM | POA: Diagnosis not present

## 2022-03-22 DIAGNOSIS — H401111 Primary open-angle glaucoma, right eye, mild stage: Secondary | ICD-10-CM | POA: Diagnosis not present

## 2022-03-22 DIAGNOSIS — H25813 Combined forms of age-related cataract, bilateral: Secondary | ICD-10-CM | POA: Diagnosis not present

## 2022-03-22 DIAGNOSIS — H40033 Anatomical narrow angle, bilateral: Secondary | ICD-10-CM | POA: Diagnosis not present

## 2022-03-22 DIAGNOSIS — H524 Presbyopia: Secondary | ICD-10-CM | POA: Diagnosis not present

## 2022-03-22 DIAGNOSIS — H401122 Primary open-angle glaucoma, left eye, moderate stage: Secondary | ICD-10-CM | POA: Diagnosis not present

## 2022-08-14 DIAGNOSIS — E039 Hypothyroidism, unspecified: Secondary | ICD-10-CM | POA: Diagnosis not present

## 2022-08-14 DIAGNOSIS — E669 Obesity, unspecified: Secondary | ICD-10-CM | POA: Diagnosis not present

## 2022-08-14 DIAGNOSIS — E7800 Pure hypercholesterolemia, unspecified: Secondary | ICD-10-CM | POA: Diagnosis not present

## 2022-08-14 DIAGNOSIS — I10 Essential (primary) hypertension: Secondary | ICD-10-CM | POA: Diagnosis not present

## 2022-09-25 DIAGNOSIS — H40033 Anatomical narrow angle, bilateral: Secondary | ICD-10-CM | POA: Diagnosis not present

## 2023-02-21 DIAGNOSIS — Z1331 Encounter for screening for depression: Secondary | ICD-10-CM | POA: Diagnosis not present

## 2023-02-21 DIAGNOSIS — Z90710 Acquired absence of both cervix and uterus: Secondary | ICD-10-CM | POA: Diagnosis not present

## 2023-02-21 DIAGNOSIS — Z0000 Encounter for general adult medical examination without abnormal findings: Secondary | ICD-10-CM | POA: Diagnosis not present

## 2023-02-21 DIAGNOSIS — M1712 Unilateral primary osteoarthritis, left knee: Secondary | ICD-10-CM | POA: Diagnosis not present

## 2023-02-21 DIAGNOSIS — E7800 Pure hypercholesterolemia, unspecified: Secondary | ICD-10-CM | POA: Diagnosis not present

## 2023-02-21 DIAGNOSIS — U071 COVID-19: Secondary | ICD-10-CM | POA: Diagnosis not present

## 2023-02-21 DIAGNOSIS — E669 Obesity, unspecified: Secondary | ICD-10-CM | POA: Diagnosis not present

## 2023-02-21 DIAGNOSIS — I10 Essential (primary) hypertension: Secondary | ICD-10-CM | POA: Diagnosis not present

## 2023-02-21 DIAGNOSIS — M129 Arthropathy, unspecified: Secondary | ICD-10-CM | POA: Diagnosis not present

## 2023-02-21 DIAGNOSIS — E039 Hypothyroidism, unspecified: Secondary | ICD-10-CM | POA: Diagnosis not present

## 2023-04-01 DIAGNOSIS — H40033 Anatomical narrow angle, bilateral: Secondary | ICD-10-CM | POA: Diagnosis not present

## 2023-04-01 DIAGNOSIS — H401111 Primary open-angle glaucoma, right eye, mild stage: Secondary | ICD-10-CM | POA: Diagnosis not present

## 2023-04-01 DIAGNOSIS — H401122 Primary open-angle glaucoma, left eye, moderate stage: Secondary | ICD-10-CM | POA: Diagnosis not present

## 2023-04-01 DIAGNOSIS — H524 Presbyopia: Secondary | ICD-10-CM | POA: Diagnosis not present

## 2023-04-01 DIAGNOSIS — H25813 Combined forms of age-related cataract, bilateral: Secondary | ICD-10-CM | POA: Diagnosis not present

## 2023-04-03 DIAGNOSIS — Z1231 Encounter for screening mammogram for malignant neoplasm of breast: Secondary | ICD-10-CM | POA: Diagnosis not present

## 2023-04-03 DIAGNOSIS — I10 Essential (primary) hypertension: Secondary | ICD-10-CM | POA: Diagnosis not present

## 2023-04-03 DIAGNOSIS — N952 Postmenopausal atrophic vaginitis: Secondary | ICD-10-CM | POA: Diagnosis not present

## 2023-04-03 DIAGNOSIS — Z6831 Body mass index (BMI) 31.0-31.9, adult: Secondary | ICD-10-CM | POA: Diagnosis not present

## 2023-04-03 DIAGNOSIS — Z01419 Encounter for gynecological examination (general) (routine) without abnormal findings: Secondary | ICD-10-CM | POA: Diagnosis not present

## 2023-04-03 DIAGNOSIS — Z800 Family history of malignant neoplasm of digestive organs: Secondary | ICD-10-CM | POA: Diagnosis not present

## 2023-04-03 DIAGNOSIS — Z803 Family history of malignant neoplasm of breast: Secondary | ICD-10-CM | POA: Diagnosis not present

## 2023-04-03 DIAGNOSIS — Z1330 Encounter for screening examination for mental health and behavioral disorders, unspecified: Secondary | ICD-10-CM | POA: Diagnosis not present

## 2023-04-03 DIAGNOSIS — Z13820 Encounter for screening for osteoporosis: Secondary | ICD-10-CM | POA: Diagnosis not present

## 2023-05-14 DIAGNOSIS — Z13820 Encounter for screening for osteoporosis: Secondary | ICD-10-CM | POA: Diagnosis not present

## 2023-05-14 DIAGNOSIS — Z780 Asymptomatic menopausal state: Secondary | ICD-10-CM | POA: Diagnosis not present

## 2023-08-29 DIAGNOSIS — L669 Cicatricial alopecia, unspecified: Secondary | ICD-10-CM | POA: Diagnosis not present

## 2023-08-29 DIAGNOSIS — E7800 Pure hypercholesterolemia, unspecified: Secondary | ICD-10-CM | POA: Diagnosis not present

## 2023-08-29 DIAGNOSIS — E559 Vitamin D deficiency, unspecified: Secondary | ICD-10-CM | POA: Diagnosis not present

## 2023-08-29 DIAGNOSIS — I10 Essential (primary) hypertension: Secondary | ICD-10-CM | POA: Diagnosis not present

## 2023-10-03 DIAGNOSIS — H401122 Primary open-angle glaucoma, left eye, moderate stage: Secondary | ICD-10-CM | POA: Diagnosis not present

## 2023-10-03 DIAGNOSIS — H401111 Primary open-angle glaucoma, right eye, mild stage: Secondary | ICD-10-CM | POA: Diagnosis not present

## 2024-01-22 ENCOUNTER — Other Ambulatory Visit: Payer: Self-pay | Admitting: Obstetrics and Gynecology

## 2024-01-22 DIAGNOSIS — Z1231 Encounter for screening mammogram for malignant neoplasm of breast: Secondary | ICD-10-CM

## 2024-03-03 DIAGNOSIS — M25511 Pain in right shoulder: Secondary | ICD-10-CM | POA: Diagnosis not present

## 2024-03-03 DIAGNOSIS — M67411 Ganglion, right shoulder: Secondary | ICD-10-CM | POA: Diagnosis not present

## 2024-03-19 DIAGNOSIS — I10 Essential (primary) hypertension: Secondary | ICD-10-CM | POA: Diagnosis not present

## 2024-03-19 DIAGNOSIS — E039 Hypothyroidism, unspecified: Secondary | ICD-10-CM | POA: Diagnosis not present

## 2024-03-19 DIAGNOSIS — Z23 Encounter for immunization: Secondary | ICD-10-CM | POA: Diagnosis not present

## 2024-03-19 DIAGNOSIS — M1712 Unilateral primary osteoarthritis, left knee: Secondary | ICD-10-CM | POA: Diagnosis not present

## 2024-03-19 DIAGNOSIS — Z0000 Encounter for general adult medical examination without abnormal findings: Secondary | ICD-10-CM | POA: Diagnosis not present

## 2024-03-19 DIAGNOSIS — L669 Cicatricial alopecia, unspecified: Secondary | ICD-10-CM | POA: Diagnosis not present

## 2024-03-19 DIAGNOSIS — Z1331 Encounter for screening for depression: Secondary | ICD-10-CM | POA: Diagnosis not present

## 2024-03-19 DIAGNOSIS — Z136 Encounter for screening for cardiovascular disorders: Secondary | ICD-10-CM | POA: Diagnosis not present

## 2024-03-19 DIAGNOSIS — Z803 Family history of malignant neoplasm of breast: Secondary | ICD-10-CM | POA: Diagnosis not present

## 2024-03-19 DIAGNOSIS — E559 Vitamin D deficiency, unspecified: Secondary | ICD-10-CM | POA: Diagnosis not present

## 2024-03-19 DIAGNOSIS — E7800 Pure hypercholesterolemia, unspecified: Secondary | ICD-10-CM | POA: Diagnosis not present

## 2024-04-06 DIAGNOSIS — H524 Presbyopia: Secondary | ICD-10-CM | POA: Diagnosis not present

## 2024-04-06 DIAGNOSIS — H25813 Combined forms of age-related cataract, bilateral: Secondary | ICD-10-CM | POA: Diagnosis not present

## 2024-04-06 DIAGNOSIS — H401122 Primary open-angle glaucoma, left eye, moderate stage: Secondary | ICD-10-CM | POA: Diagnosis not present

## 2024-04-06 DIAGNOSIS — H40033 Anatomical narrow angle, bilateral: Secondary | ICD-10-CM | POA: Diagnosis not present

## 2024-04-06 DIAGNOSIS — H401111 Primary open-angle glaucoma, right eye, mild stage: Secondary | ICD-10-CM | POA: Diagnosis not present

## 2024-04-09 ENCOUNTER — Ambulatory Visit
Admission: RE | Admit: 2024-04-09 | Discharge: 2024-04-09 | Disposition: A | Source: Ambulatory Visit | Attending: Obstetrics and Gynecology | Admitting: Obstetrics and Gynecology

## 2024-04-09 DIAGNOSIS — Z1231 Encounter for screening mammogram for malignant neoplasm of breast: Secondary | ICD-10-CM | POA: Diagnosis not present
